# Patient Record
Sex: Female | Born: 1975
Health system: Southern US, Community
[De-identification: ages and names within clinical notes are randomized; demographics above are authoritative.]

## PROBLEM LIST (undated history)

## (undated) DIAGNOSIS — N289 Disorder of kidney and ureter, unspecified: Secondary | ICD-10-CM

## (undated) DIAGNOSIS — R7301 Impaired fasting glucose: Secondary | ICD-10-CM

## (undated) DIAGNOSIS — R002 Palpitations: Secondary | ICD-10-CM

## (undated) DIAGNOSIS — E785 Hyperlipidemia, unspecified: Secondary | ICD-10-CM

## (undated) DIAGNOSIS — E669 Obesity, unspecified: Secondary | ICD-10-CM

## (undated) DIAGNOSIS — F419 Anxiety disorder, unspecified: Secondary | ICD-10-CM

## (undated) DIAGNOSIS — K8020 Calculus of gallbladder without cholecystitis without obstruction: Secondary | ICD-10-CM

## (undated) DIAGNOSIS — K5900 Constipation, unspecified: Secondary | ICD-10-CM

## (undated) DIAGNOSIS — Z91018 Allergy to other foods: Secondary | ICD-10-CM

## (undated) DIAGNOSIS — R079 Chest pain, unspecified: Secondary | ICD-10-CM

## (undated) DIAGNOSIS — N200 Calculus of kidney: Secondary | ICD-10-CM

## (undated) HISTORY — DX: Allergy to other foods: Z91.018

## (undated) HISTORY — DX: Constipation, unspecified: K59.00

## (undated) HISTORY — DX: Calculus of kidney: N20.0

## (undated) HISTORY — DX: Calculus of gallbladder without cholecystitis without obstruction: K80.20

## (undated) HISTORY — PX: APPENDECTOMY: SHX54

## (undated) HISTORY — DX: Chest pain, unspecified: R07.9

## (undated) HISTORY — DX: Hyperlipidemia, unspecified: E78.5

## (undated) HISTORY — DX: Impaired fasting glucose: R73.01

## (undated) HISTORY — DX: Palpitations: R00.2

## (undated) HISTORY — DX: Disorder of kidney and ureter, unspecified: N28.9

## (undated) HISTORY — DX: Anxiety disorder, unspecified: F41.9

## (undated) HISTORY — DX: Obesity, unspecified: E66.9

## (undated) HISTORY — PX: CHOLECYSTECTOMY: SHX55

---

## 1999-08-27 ENCOUNTER — Other Ambulatory Visit: Admission: RE | Admit: 1999-08-27 | Discharge: 1999-08-27 | Payer: Self-pay | Admitting: Obstetrics and Gynecology

## 2000-12-27 ENCOUNTER — Other Ambulatory Visit: Admission: RE | Admit: 2000-12-27 | Discharge: 2000-12-27 | Payer: Self-pay | Admitting: Obstetrics and Gynecology

## 2002-11-03 ENCOUNTER — Other Ambulatory Visit: Admission: RE | Admit: 2002-11-03 | Discharge: 2002-11-03 | Payer: Self-pay | Admitting: *Deleted

## 2002-11-07 ENCOUNTER — Encounter: Payer: Self-pay | Admitting: Emergency Medicine

## 2002-11-07 ENCOUNTER — Observation Stay (HOSPITAL_COMMUNITY): Admission: EM | Admit: 2002-11-07 | Discharge: 2002-11-08 | Payer: Self-pay | Admitting: Emergency Medicine

## 2002-11-07 ENCOUNTER — Encounter (INDEPENDENT_AMBULATORY_CARE_PROVIDER_SITE_OTHER): Payer: Self-pay | Admitting: Specialist

## 2003-11-27 ENCOUNTER — Other Ambulatory Visit: Admission: RE | Admit: 2003-11-27 | Discharge: 2003-11-27 | Payer: Self-pay | Admitting: *Deleted

## 2005-03-03 ENCOUNTER — Encounter (INDEPENDENT_AMBULATORY_CARE_PROVIDER_SITE_OTHER): Payer: Self-pay | Admitting: Specialist

## 2005-03-03 ENCOUNTER — Ambulatory Visit (HOSPITAL_COMMUNITY): Admission: RE | Admit: 2005-03-03 | Discharge: 2005-03-03 | Payer: Self-pay | Admitting: *Deleted

## 2009-03-19 ENCOUNTER — Ambulatory Visit: Payer: Self-pay | Admitting: Diagnostic Radiology

## 2009-03-19 ENCOUNTER — Emergency Department (HOSPITAL_BASED_OUTPATIENT_CLINIC_OR_DEPARTMENT_OTHER): Admission: EM | Admit: 2009-03-19 | Discharge: 2009-03-19 | Payer: Self-pay | Admitting: Emergency Medicine

## 2011-02-27 NOTE — Op Note (Signed)
Denise Strong, Denise Strong                 ACCOUNT NO.:  [id number]   MEDICAL RECORD NO.:  [id number]          PATIENT TYPE:  AMB   LOCATION:  DAY                          FACILITY:  [facility]   PHYSICIAN:  Vikki Ports, MDDATE OF BIRTH:  1975-12-29   DATE OF PROCEDURE:  03/03/2005  DATE OF DISCHARGE:                                 OPERATIVE REPORT   PREOPERATIVE DIAGNOSIS:  Symptomatic cholelithiasis.   POSTOPERATIVE DIAGNOSIS:  Symptomatic cholelithiasis.   PROCEDURE:  Laparoscopic cholecystectomy.   SURGEON:  Vikki Ports, M.D.   ASSISTANT:  None.   ANESTHESIA:  General anesthesia.   DESCRIPTION OF PROCEDURE:  Patient was taken to the operating room and  placed in the supine position.  After adequate general anesthesia was  induced using endotracheal tube, the abdomen was prepped and draped in  normal sterile fashion.  Using a vertically oriented supraumbilical  incision, I dissected down to the fascia.  It was opened vertically.  An 0  Vicryl pursestring suture was placed around the fascial defect and Hasson  trocar was placed.  Pneumoperitoneum was obtained and under direct  visualization, an 11 mm trocar was placed in the subxiphoid region, two 5 mm  trocars placed in the right abdomen.  Gallbladder was identified and  retracted cephalad.  Dissection began at the neck which easily showed a long  thin cystic duct which was triply clipped and divided.  Cystic artery was  dissected in a similar fashion creating an excellent window posterior to it.  It was triply clipped and divided.  Gallbladder was on a very thin  mesentery.  It was taken off the gallbladder bed without difficulty and  removed through the umbilical port.  Fascial defect was closed with Vicryl  suture.  Skin incisions were closed with subcuticular 4-0 Monocryl.  Steri-  Strips and Dermabond was applied.  The patient tolerated the procedure well  and went to PACU in good condition.      KRH/MEDQ   D:  03/03/2005  T:  03/03/2005  Job:  [id number]

## 2011-02-27 NOTE — Op Note (Signed)
   Denise Strong, Denise Strong                             ACCOUNT NO.:  [id number]   MEDICAL RECORD NO.:  [id number]                   PATIENT TYPE:  OBV   LOCATION:  5737                                 FACILITY:  [facility]   PHYSICIAN:  Vikki Ports, M.D.         DATE OF BIRTH:  1975-12-12   DATE OF PROCEDURE:  11/07/2002  DATE OF DISCHARGE:  11/08/2002                                 OPERATIVE REPORT   PREOPERATIVE DIAGNOSIS:  Acute appendicitis.   POSTOPERATIVE DIAGNOSIS:  Acute appendicitis.   PROCEDURE:  Laparoscopic appendectomy   SURGEON:  Vikki Ports, M.D.   ANESTHESIA:  General   DESCRIPTION OF PROCEDURE:  The patient was taken to the operating room and  placed in the supine position.  After adequate anesthesia was induced with  endotracheal tube, the abdomen was prepped and draped in a normal sterile  fashion.  Using a transverse infraumbilical incision, I dissected down to  the fascia.  This was opened vertically.  An 0 Vicryl purse string suture  was placed on the fascial defect and the peritoneum was entered.  Hasson  trocar was placed in the abdomen, and pneumoperitoneum was obtained.  A 12  mm port was placed in the left suprapubic region and a 5 mm port was placed  in the right abdomen.  The appendix was identified.  It appeared acutely  inflamed with minimal free fluid but no evidence of perforation.  It was  retracted Anteriorly and using the Harmonic scalpel, the mesoappendix was  taken down.  This freed up the entire base of the appendix which was  transected using an endoscopic GIA stapling device.  Adequate hemostasis was  insured and the appendix was placed in an Endo-Catch bag and removed through  the umbilical port.  The right lower quadrant was then copiously irrigated.  Pneumoperitoneum was released.  The infraumbilical fascial defect was closed  with 0 Vicryl purse string suture.  The skin incisions were closed with  staples.  Incisions were  injected using 0.5 Marcaine.  Sterile dressings  were applied.  The patient tolerated the procedure well, and she went to  Post Anesthesia Care Unit in good condition                                               Vikki Ports, M.D.    KRH/MEDQ  D:  11/11/2002  T:  11/11/2002  Job:  [id number]

## 2013-01-02 ENCOUNTER — Other Ambulatory Visit: Payer: Self-pay | Admitting: Obstetrics and Gynecology

## 2013-01-02 DIAGNOSIS — N644 Mastodynia: Secondary | ICD-10-CM

## 2013-01-03 ENCOUNTER — Other Ambulatory Visit: Payer: Self-pay | Admitting: Obstetrics and Gynecology

## 2013-01-03 DIAGNOSIS — N644 Mastodynia: Secondary | ICD-10-CM

## 2013-01-13 ENCOUNTER — Ambulatory Visit
Admission: RE | Admit: 2013-01-13 | Discharge: 2013-01-13 | Disposition: A | Discharge status: Home / Self Care | Payer: 59 | Source: Ambulatory Visit | Attending: Obstetrics and Gynecology | Admitting: Obstetrics and Gynecology

## 2013-01-13 DIAGNOSIS — N644 Mastodynia: Secondary | ICD-10-CM

## 2013-03-11 ENCOUNTER — Emergency Department (HOSPITAL_BASED_OUTPATIENT_CLINIC_OR_DEPARTMENT_OTHER): Payer: 59

## 2013-03-11 ENCOUNTER — Emergency Department (HOSPITAL_BASED_OUTPATIENT_CLINIC_OR_DEPARTMENT_OTHER)
Admission: EM | Admit: 2013-03-11 | Discharge: 2013-03-11 | Disposition: A | Discharge status: Home / Self Care | Payer: 59 | Attending: Emergency Medicine | Admitting: Emergency Medicine

## 2013-03-11 ENCOUNTER — Encounter (HOSPITAL_BASED_OUTPATIENT_CLINIC_OR_DEPARTMENT_OTHER): Payer: Self-pay

## 2013-03-11 DIAGNOSIS — S40011A Contusion of right shoulder, initial encounter: Secondary | ICD-10-CM

## 2013-03-11 DIAGNOSIS — Z880 Allergy status to penicillin: Secondary | ICD-10-CM | POA: Insufficient documentation

## 2013-03-11 DIAGNOSIS — S40019A Contusion of unspecified shoulder, initial encounter: Secondary | ICD-10-CM | POA: Insufficient documentation

## 2013-03-11 DIAGNOSIS — Z87448 Personal history of other diseases of urinary system: Secondary | ICD-10-CM | POA: Insufficient documentation

## 2013-03-11 DIAGNOSIS — Y9389 Activity, other specified: Secondary | ICD-10-CM | POA: Insufficient documentation

## 2013-03-11 DIAGNOSIS — Z79899 Other long term (current) drug therapy: Secondary | ICD-10-CM | POA: Insufficient documentation

## 2013-03-11 DIAGNOSIS — Y92410 Unspecified street and highway as the place of occurrence of the external cause: Secondary | ICD-10-CM | POA: Insufficient documentation

## 2013-03-11 HISTORY — DX: Disorder of kidney and ureter, unspecified: N28.9

## 2013-03-11 MED ORDER — HYDROCODONE-ACETAMINOPHEN 5-325 MG PO TABS: 2.0000 | ORAL_TABLET | ORAL | Status: DC | PRN
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
## 2014-03-07 DIAGNOSIS — N390 Urinary tract infection, site not specified: Secondary | ICD-10-CM | POA: Insufficient documentation

## 2014-03-21 DIAGNOSIS — G43909 Migraine, unspecified, not intractable, without status migrainosus: Secondary | ICD-10-CM | POA: Insufficient documentation

## 2014-03-21 DIAGNOSIS — E7849 Other hyperlipidemia: Secondary | ICD-10-CM | POA: Insufficient documentation

## 2014-03-21 DIAGNOSIS — N200 Calculus of kidney: Secondary | ICD-10-CM | POA: Insufficient documentation

## 2014-03-21 DIAGNOSIS — R1084 Generalized abdominal pain: Secondary | ICD-10-CM | POA: Insufficient documentation

## 2014-03-21 DIAGNOSIS — J309 Allergic rhinitis, unspecified: Secondary | ICD-10-CM | POA: Insufficient documentation

## 2015-12-03 DIAGNOSIS — H52223 Regular astigmatism, bilateral: Secondary | ICD-10-CM | POA: Diagnosis not present

## 2015-12-03 DIAGNOSIS — H5203 Hypermetropia, bilateral: Secondary | ICD-10-CM | POA: Diagnosis not present

## 2016-01-02 DIAGNOSIS — Z0000 Encounter for general adult medical examination without abnormal findings: Secondary | ICD-10-CM | POA: Diagnosis not present

## 2016-01-02 LAB — BASIC METABOLIC PANEL
BUN: 14 (ref 4–21)
Creatinine: 0.6 (ref 0.5–1.1)
Glucose: 100
Potassium: 4.1 (ref 3.4–5.3)
Sodium: 142 (ref 137–147)

## 2016-01-02 LAB — CBC AND DIFFERENTIAL
HEMATOCRIT: 42 (ref 36–46)
HEMOGLOBIN: 14.3 (ref 12.0–16.0)
NEUTROS ABS: 4
PLATELETS: 264 (ref 150–399)
WBC: 7.2

## 2016-01-02 LAB — LIPID PANEL
CHOLESTEROL: 209 — AB (ref 0–200)
HDL: 48 (ref 35–70)
LDL CALC: 143
TRIGLYCERIDES: 229 — AB (ref 40–160)

## 2016-01-02 LAB — HEPATIC FUNCTION PANEL
ALK PHOS: 61 (ref 25–125)
ALT: 12 (ref 7–35)
AST: 11 — AB (ref 13–35)
Bilirubin, Total: 0.6

## 2016-01-16 DIAGNOSIS — G43009 Migraine without aura, not intractable, without status migrainosus: Secondary | ICD-10-CM | POA: Diagnosis not present

## 2016-01-16 DIAGNOSIS — E784 Other hyperlipidemia: Secondary | ICD-10-CM | POA: Diagnosis not present

## 2016-01-16 DIAGNOSIS — B373 Candidiasis of vulva and vagina: Secondary | ICD-10-CM | POA: Diagnosis not present

## 2016-01-16 DIAGNOSIS — Z0000 Encounter for general adult medical examination without abnormal findings: Secondary | ICD-10-CM | POA: Insufficient documentation

## 2016-02-13 DIAGNOSIS — I499 Cardiac arrhythmia, unspecified: Secondary | ICD-10-CM | POA: Insufficient documentation

## 2016-02-13 DIAGNOSIS — R002 Palpitations: Secondary | ICD-10-CM | POA: Insufficient documentation

## 2016-02-20 ENCOUNTER — Ambulatory Visit (INDEPENDENT_AMBULATORY_CARE_PROVIDER_SITE_OTHER): Payer: 59 | Admitting: Cardiovascular Disease

## 2016-02-20 ENCOUNTER — Encounter: Payer: Self-pay | Admitting: Cardiovascular Disease

## 2016-02-20 ENCOUNTER — Encounter (INDEPENDENT_AMBULATORY_CARE_PROVIDER_SITE_OTHER): Payer: 59

## 2016-02-20 VITALS — BP 118/72 | HR 64 | Ht 65.0 in | Wt 174.6 lb

## 2016-02-20 DIAGNOSIS — R072 Precordial pain: Secondary | ICD-10-CM | POA: Diagnosis not present

## 2016-02-20 DIAGNOSIS — R002 Palpitations: Secondary | ICD-10-CM

## 2016-02-20 DIAGNOSIS — R079 Chest pain, unspecified: Secondary | ICD-10-CM | POA: Diagnosis not present

## 2016-02-20 DIAGNOSIS — E785 Hyperlipidemia, unspecified: Secondary | ICD-10-CM | POA: Diagnosis not present

## 2016-02-20 HISTORY — DX: Palpitations: R00.2

## 2016-02-20 HISTORY — DX: Chest pain, unspecified: R07.9

## 2016-02-20 HISTORY — DX: Hyperlipidemia, unspecified: E78.5

## 2016-02-20 LAB — BASIC METABOLIC PANEL
BUN: 14 mg/dL (ref 7–25)
CALCIUM: 9.1 mg/dL (ref 8.6–10.2)
CO2: 29 mmol/L (ref 20–31)
CREATININE: 0.56 mg/dL (ref 0.50–1.10)
Chloride: 104 mmol/L (ref 98–110)
Glucose, Bld: 85 mg/dL (ref 65–99)
Potassium: 4.1 mmol/L (ref 3.5–5.3)
SODIUM: 140 mmol/L (ref 135–146)

## 2016-02-20 LAB — MAGNESIUM: MAGNESIUM: 2.1 mg/dL (ref 1.5–2.5)

## 2016-02-20 NOTE — Progress Notes (Signed)
Cardiology Office Note   Date:  02/20/2016   ID:  [name], DOB [birth year], MRN [id number]  PCP:  Garlan Fillers, MD Cardiologist:   Skeet Latch, MD   Chief Complaint  Patient presents with  . 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 Ms. Crivelli saw her PCP, Dr. Addison Naegeli, in April.   A week prior to that appointment she had an episode of palpitations that was severe and associated with lightheadedness.  The rhythm was irregular and lasted for 30-45 minutes.  It improved after coughing.  Since then she has noted palpitations that occur almost daily and last for less than a minute.  They are associated with shortness of breath and sometimes with chest pressure.  She denies syncope.  They are not associated with work, stress, or caffeine.  She also notes chest pressure that occurs randomly.  There is no associated shortness of breath, nausea or diaphoresis.  The episodes last for a few minutes and radiate across her chest.  They are 0.5-1/10 in severity.  She does not exercise regularly but when she has time she likes to run.  She runs for approximately 30 minutes and does not get chest pain or shortness of breath.  She has not noted any lower extremity edema, orthopnea or PND.    Ms. Hallisey notes that her cholesterol levels have always been mildly elevated.  She has not been interested in taking a statin.    Past Medical History  Diagnosis Date  . Renal disorder   . Palpitations 02/20/2016  . Chest pain 02/20/2016  . Hyperlipidemia 02/20/2016     Past Surgical History  Procedure Laterality Date  . Appendectomy    . Cholecystectomy       No current outpatient prescriptions on file.   No current facility-administered medications for this visit.    Allergies:   Ceclor; Erythromycin; Iodine; and Penicillins    Social History:  The patient  reports that she quit smoking about 21 years ago. She has never used smokeless tobacco. She reports that she does not drink alcohol or use illicit drugs.   Family History:  The patient's family history includes AAA (abdominal aortic aneurysm) in her maternal grandfather; Dementia in her paternal grandfather; Diabetes in her brother, maternal grandmother, and mother; Hypertension in her father; Stroke in her father.    ROS:  Please see the history of present illness.   Otherwise, review of systems are positive for none.   All other systems are reviewed and negative.    PHYSICAL EXAM: VS:  BP 118/72 mmHg  Pulse 64  Ht 5\' 5"  (1.651 m)  Wt 79.198 kg (174 lb 9.6 oz)  BMI 29.05 kg/m2 , BMI Body mass index is 29.05 kg/(m^2). GENERAL:  Well appearing HEENT:  Pupils equal round and reactive, fundi not visualized, oral mucosa unremarkable NECK:  No jugular venous distention, waveform within normal limits, carotid upstroke brisk and symmetric, no bruits, no thyromegaly LYMPHATICS:  No cervical adenopathy LUNGS:  Clear to auscultation bilaterally HEART:  RRR.  PMI not displaced or sustained,S1 and S2 within normal limits, no  S3, no S4, no clicks, no rubs, no murmurs ABD:  Flat, positive bowel sounds normal in frequency in pitch, no bruits, no rebound, no guarding, no midline pulsatile mass, no hepatomegaly, no splenomegaly EXT:  2 plus pulses throughout, no edema, no cyanosis no clubbing SKIN:  No rashes no nodules NEURO:  Cranial nerves II through XII grossly intact, motor grossly intact throughout PSYCH:  Cognitively intact, oriented to person place and time   EKG:  EKG is ordered  today. The ekg ordered today demonstrates sinus rhythm. Rate 64 bpm.   Recent Labs: No results found for requested labs within last 365 days.    Lipid Panel No results found for: CHOL, TRIG, HDL, CHOLHDL, VLDL, LDLCALC, LDLDIRECT    Wt Readings from Last 3 Encounters:  02/20/16 79.198 kg (174 lb 9.6 oz)  03/11/13 74.844 kg (165 lb)      ASSESSMENT AND PLAN:  # Palpitations: Ms. Wainright's palpitations are likely PACs and PVCs.  We will obtain a 7 day event monitor to better assess.  We will also check a basic metabolic panel, magnesium, TSH and free T4.  She had a CBC with her PCP recently.  We will obtain these records.    # Chest pain: Ms. Mckibben's symptoms are likely not due to ischemia given that she does not have exertional chest discomfort. However, we will obtain an ETT to ensure that this is not an atypical presentation of angina.   # Hyperlipidemia: Ms. Ahlstrom reports a history of elevated lipid.  We will obtain these records to calculate her ASCVD 10 year risk.  We discussed the importance of increasing her exercise to at least 30-40 minutes most days of the week and improving her diet.   Current medicines are reviewed at length with the patient today.  The patient does not have concerns regarding medicines.  The following changes have been made:  no change  Labs/ tests ordered today include:   Orders Placed This Encounter  Procedures  . T4, free  . TSH  . Basic metabolic panel  . Magnesium  . Cardiac event monitor  . Exercise Tolerance Test  . EKG 12-Lead     Disposition:   FU with Reza Crymes C. Oval Linsey, MD, [facility] in     This note was written with the assistance of speech recognition software.  Please excuse any transcriptional errors.  Signed, Anira Senegal C. Oval Linsey, MD, [facility]  02/20/2016 10:50 PM    [facility]

## 2016-02-20 NOTE — Patient Instructions (Addendum)
Medication Instructions:  NO ADDED MEDICATIONS TODAY   Labwork: BMET/MAGNESIUM/TSH/FT4 AT [facility] ON FIRST FLOOR  Testing/Procedures: Your physician has recommended that you wear an event monitor. Event monitors are medical devices that record the heart's electrical activity. Doctors most often [place] these monitors to diagnose arrhythmias. Arrhythmias are problems with the speed or rhythm of the heartbeat. The monitor is a small, portable device. You can wear one while you do your normal daily activities. This is usually used to diagnose what is causing palpitations/syncope (passing out). [facility] has requested that you have an exercise tolerance test. For further information please visit [email or web address]. Please also follow instruction sheet, as given.  Follow-Up: Your physician recommends that you schedule a follow-up appointment in: [place]  If you need a refill on your cardiac medications before your next appointment, please call your pharmacy.

## 2016-02-21 DIAGNOSIS — R002 Palpitations: Secondary | ICD-10-CM

## 2016-02-21 LAB — T4, FREE: FREE T4: 1.1 ng/dL (ref 0.8–1.8)

## 2016-02-21 LAB — TSH: TSH: 1.31 m[IU]/L

## 2016-03-06 ENCOUNTER — Telehealth (HOSPITAL_COMMUNITY): Payer: Self-pay

## 2016-03-06 NOTE — Telephone Encounter (Signed)
UTR Encounter complete. 

## 2016-03-10 ENCOUNTER — Telehealth (HOSPITAL_COMMUNITY): Payer: Self-pay

## 2016-03-10 NOTE — Telephone Encounter (Signed)
Encounter complete. 

## 2016-03-11 ENCOUNTER — Ambulatory Visit (HOSPITAL_COMMUNITY)
Admission: RE | Admit: 2016-03-11 | Discharge: 2016-03-11 | Disposition: A | Discharge status: Home / Self Care | Payer: 59 | Source: Ambulatory Visit | Attending: Internal Medicine | Admitting: Internal Medicine

## 2016-03-11 DIAGNOSIS — R079 Chest pain, unspecified: Secondary | ICD-10-CM | POA: Diagnosis not present

## 2016-03-11 DIAGNOSIS — R002 Palpitations: Secondary | ICD-10-CM | POA: Insufficient documentation

## 2016-03-11 DIAGNOSIS — R5383 Other fatigue: Secondary | ICD-10-CM | POA: Insufficient documentation

## 2016-03-11 DIAGNOSIS — R0602 Shortness of breath: Secondary | ICD-10-CM | POA: Insufficient documentation

## 2016-03-11 LAB — EXERCISE TOLERANCE TEST
CHL CUP MPHR: 181 {beats}/min
CSEPEW: 11.8 METS
CSEPHR: 90 %
CSEPPHR: 164 {beats}/min
Exercise duration (min): 10 min
Exercise duration (sec): 2 s
RPE: 17
Rest HR: 67 {beats}/min

## 2016-03-13 ENCOUNTER — Telehealth: Payer: Self-pay | Admitting: *Deleted

## 2016-03-13 DIAGNOSIS — R0789 Other chest pain: Secondary | ICD-10-CM

## 2016-03-13 NOTE — Telephone Encounter (Signed)
-----   Message from Skeet Latch, MD sent at 03/11/2016  5:26 PM EDT ----- Stress test was equivocal.  Please order cardiac CT-A to evaluate for ischemia.

## 2016-03-13 NOTE — Telephone Encounter (Signed)
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

## 2016-03-18 ENCOUNTER — Telehealth (HOSPITAL_COMMUNITY): Payer: Self-pay

## 2016-03-18 NOTE — Telephone Encounter (Signed)
Encounter complete. 

## 2016-03-20 ENCOUNTER — Ambulatory Visit (HOSPITAL_COMMUNITY)
Admission: RE | Admit: 2016-03-20 | Discharge: 2016-03-20 | Disposition: A | Discharge status: Home / Self Care | Payer: 59 | Source: Ambulatory Visit | Attending: Cardiology | Admitting: Cardiology

## 2016-03-20 DIAGNOSIS — R42 Dizziness and giddiness: Secondary | ICD-10-CM | POA: Insufficient documentation

## 2016-03-20 DIAGNOSIS — R0609 Other forms of dyspnea: Secondary | ICD-10-CM | POA: Insufficient documentation

## 2016-03-20 DIAGNOSIS — R002 Palpitations: Secondary | ICD-10-CM | POA: Insufficient documentation

## 2016-03-20 DIAGNOSIS — R0789 Other chest pain: Secondary | ICD-10-CM | POA: Diagnosis not present

## 2016-03-20 LAB — MYOCARDIAL PERFUSION IMAGING
CHL CUP NUCLEAR SDS: 5
CSEPPHR: 100 {beats}/min
LV sys vol: 39 mL
LVDIAVOL: 95 mL (ref 46–106)
NUC STRESS TID: 1.13
Rest HR: 56 {beats}/min
SRS: 0
SSS: 5

## 2016-03-20 MED ORDER — TECHNETIUM TC 99M TETROFOSMIN IV KIT
10.4000 | PACK | Freq: Once | INTRAVENOUS | Status: AC | PRN
  Administered 2016-03-20: 10 via INTRAVENOUS
  Filled 2016-03-20: qty 10

## 2016-03-20 MED ORDER — REGADENOSON 0.4 MG/5ML IV SOLN
0.4000 mg | Freq: Once | INTRAVENOUS | Status: AC
  Administered 2016-03-20: 0.4 mg via INTRAVENOUS

## 2016-03-20 MED ORDER — TECHNETIUM TC 99M TETROFOSMIN IV KIT
31.1000 | PACK | Freq: Once | INTRAVENOUS | Status: AC | PRN
  Administered 2016-03-20: 31.1 via INTRAVENOUS
  Filled 2016-03-20: qty 31

## 2016-03-24 ENCOUNTER — Ambulatory Visit (INDEPENDENT_AMBULATORY_CARE_PROVIDER_SITE_OTHER): Payer: 59 | Admitting: Cardiovascular Disease

## 2016-03-24 VITALS — BP 113/64 | HR 78 | Ht 65.0 in | Wt 177.0 lb

## 2016-03-24 DIAGNOSIS — R079 Chest pain, unspecified: Secondary | ICD-10-CM

## 2016-03-24 DIAGNOSIS — R002 Palpitations: Secondary | ICD-10-CM

## 2016-03-24 DIAGNOSIS — E785 Hyperlipidemia, unspecified: Secondary | ICD-10-CM

## 2016-03-24 NOTE — Progress Notes (Signed)
Cardiology Office Note   Date:  03/24/2016   ID:  Denise Strong, DOB 11-06-1975, MRN [id number]  PCP:  Garlan Fillers, MD Cardiologist:   Skeet Latch, MD   Chief Complaint  Patient presents with  . 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 Denise Strong saw her PCP, Dr. Addison Naegeli, in April.   She was referred to cardiology for evaluation of palpitations and chest pain.  She wore a  7 day event monitor that showed sinus rhythm and sinus arrhythmia.  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  She notes that the palpitations sometimes occur in stressful situations.  She has been otherwise well and denies chest pain.  She did have an exercise stress test that revealed 1-1.43mm ST depressions.  She exercised for 10 minutes on a Bruce protocol (11.8 METS).  She was referred for nuclear stress testing that was negative for ischemia.  She denies lower extremity edema, orthopnea or PND.    Past Medical History  Diagnosis Date  . Renal disorder   . Palpitations 02/20/2016  . Chest pain 02/20/2016  . Hyperlipidemia 02/20/2016    Past Surgical History  Procedure Laterality Date  . Appendectomy    . Cholecystectomy       No current outpatient prescriptions on file.   No current facility-administered medications for this visit.    Allergies:   Ceclor; Erythromycin; Iodine; and Penicillins    Social History:  The patient  reports that she quit smoking about 21 years ago. She has never used smokeless tobacco. She reports that she does not drink alcohol or use illicit drugs.   Family History:  The patient's family history includes AAA (abdominal aortic aneurysm) in her maternal grandfather; Dementia in her paternal grandfather; Diabetes in her brother, maternal grandmother,  and mother; Hypertension in her father; Stroke in her father.    ROS:  Please see the history of present illness.   Otherwise, review of systems are positive for none.   All other systems are reviewed and negative.    PHYSICAL EXAM: VS:  BP 113/64 mmHg  Pulse 78  Ht 5\' 5"  (1.651 m)  Wt 177 lb (80.287 kg)  BMI 29.45 kg/m2 , BMI Body mass index is 29.45 kg/(m^2). GENERAL:  Well appearing HEENT:  Pupils equal round and reactive, fundi not visualized, oral mucosa unremarkable NECK:  No jugular venous distention, waveform within normal limits, carotid upstroke brisk and symmetric, no bruits LYMPHATICS:  No cervical adenopathy LUNGS:  Clear to auscultation bilaterally HEART:  RRR.  PMI not displaced or sustained,S1 and S2 within normal limits, no S3, no S4, no clicks, no rubs, no murmurs ABD:  Flat, positive bowel sounds normal in frequency in pitch, no bruits, no rebound, no guarding, no midline pulsatile mass, no hepatomegaly, no splenomegaly EXT:  2 plus pulses throughout, no edema, no cyanosis no clubbing SKIN:  No rashes no nodules NEURO:  Cranial nerves II through XII grossly intact, motor grossly intact throughout PSYCH:  Cognitively intact, oriented to person place and time  EKG:  EKG is not ordered today. The ekg ordered today demonstrates sinus rhythm. Rate 64 bpm.  7 Day Event Monitor 03/2016  Quality: Fair.  Baseline artifact.  Sinus rhythm and sinus arrhythmia noted. Patient  reported chest pain and heart fluttering.    Lexiscan Myoview 03/20/16:  The left ventricular ejection fraction is normal (55-65%).  Nuclear stress EF: 58%.  There was no ST segment deviation noted during stress.  The study is normal. no ischemia. no previous MI  This is a low risk study.  ETT 03/11/16:  Normal BP response to exercise.  There was 1 to 1.70mm of J point depression with horizontal to upsloping ST segment depression and peaking of the T waves in the inferolateral leads at peak  exercise.  Good exercise tolerance. The patient went 11.8 mets.  The patient had fatigue and SOB.  Equivocal EKG for ischemia. Recommend stress echo for further evaluation.   Recent Labs: 02/20/2016: BUN 14; Creat 0.56; Magnesium 2.1; Potassium 4.1; Sodium 140; TSH 1.31    Lipid Panel No results found for: CHOL, TRIG, HDL, CHOLHDL, VLDL, LDLCALC, LDLDIRECT    Wt Readings from Last 3 Encounters:  03/24/16 177 lb (80.287 kg)  03/20/16 175 lb (79.379 kg)  02/20/16 174 lb 9.6 oz (79.198 kg)      ASSESSMENT AND PLAN:  # Palpitations: [name]'s monitor was unremarkable.  She did have symptoms while wearing it.  I suspect that her symptoms may be stress related.  Her laboratory testing has been unremarkable.   # Chest pain: ETT was concerning for ischemia but was a false positive, as the nuclear stress was negative.   Current medicines are reviewed at length with the patient today.  The patient does not have concerns regarding medicines.  The following changes have been made:  no change  Labs/ tests ordered today include:   No orders of the defined types were placed in this encounter.     Disposition:   FU with Maigan Bittinger C. Oval Linsey, MD, [facility] as needed.  Time spent: 15 minutes-Greater than 50% of this time was spent in counseling, explanation of diagnosis, planning of further management, and coordination of care.   This note was written with the assistance of speech recognition software.  Please excuse any transcriptional errors.  Signed, Rhetta Cleek C. Oval Linsey, MD, [facility]  03/24/2016 4:10 PM    [facility]

## 2016-03-24 NOTE — Patient Instructions (Signed)
Your physician recommends that you schedule a follow-up appointment in: as needed  

## 2016-03-25 ENCOUNTER — Encounter: Payer: Self-pay | Admitting: Cardiovascular Disease

## 2016-05-05 DIAGNOSIS — Z1389 Encounter for screening for other disorder: Secondary | ICD-10-CM | POA: Diagnosis not present

## 2016-05-05 DIAGNOSIS — R319 Hematuria, unspecified: Secondary | ICD-10-CM | POA: Diagnosis not present

## 2016-05-05 DIAGNOSIS — Z130 Encounter for screening for diseases of the blood and blood-forming organs and certain disorders involving the immune mechanism: Secondary | ICD-10-CM | POA: Diagnosis not present

## 2016-05-05 DIAGNOSIS — Z6829 Body mass index (BMI) 29.0-29.9, adult: Secondary | ICD-10-CM | POA: Diagnosis not present

## 2016-05-05 DIAGNOSIS — N898 Other specified noninflammatory disorders of vagina: Secondary | ICD-10-CM | POA: Diagnosis not present

## 2016-05-05 DIAGNOSIS — Z01419 Encounter for gynecological examination (general) (routine) without abnormal findings: Secondary | ICD-10-CM | POA: Diagnosis not present

## 2016-05-05 MED FILL — metroNIDAZOLE 500 MG TABS: 500 | 7 days supply | Qty: 14 | Fill #0

## 2016-09-23 IMAGING — NM NM MISC PROCEDURE
6 series · 36 of 36 positions shown · non-contrast
Comparison: none

[Series 1: wbr rest · 6.40mm/px · 6 of 64 frames shown]
[frame 6/64]
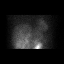
[frame 16/64]
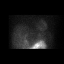
[frame 27/64]
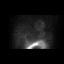
[frame 38/64]
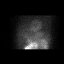
[frame 48/64]
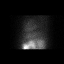
[frame 59/64]
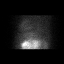

[Series 1: wbr_r-proj_st wbr rest · 6.40mm/px · 6 of 64 frames shown]
[frame 6/64]
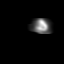
[frame 16/64]
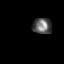
[frame 27/64]
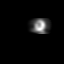
[frame 38/64]
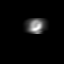
[frame 48/64]
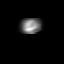
[frame 59/64]
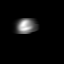

[Series 2: wbr stress-gsp · 6.40mm/px · 6 of 512 frames shown]
[frame 43/512]
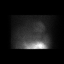
[frame 128/512]
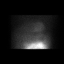
[frame 214/512]
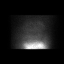
[frame 299/512]
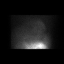
[frame 384/512]
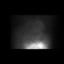
[frame 470/512]
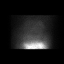

[Series 2: wbr_s-proj_st wbr stress-gsp · 6.40mm/px · 6 of 512 frames shown]
[frame 43/512]
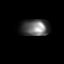
[frame 128/512]
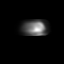
[frame 214/512]
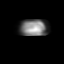
[frame 299/512]
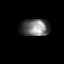
[frame 384/512]
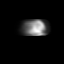
[frame 470/512]
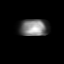

[Series 3: wbr stress-sum-em · 6.40mm/px · 6 of 64 frames shown]
[frame 6/64]
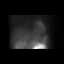
[frame 16/64]
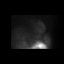
[frame 27/64]
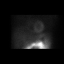
[frame 38/64]
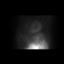
[frame 48/64]
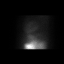
[frame 59/64]
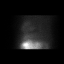

[Series 3: wbr_s-proj_st wbr stress-sum-em · 6.40mm/px · 6 of 64 frames shown]
[frame 6/64]
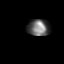
[frame 16/64]
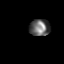
[frame 27/64]
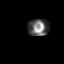
[frame 38/64]
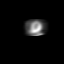
[frame 48/64]
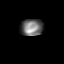
[frame 59/64]
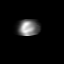

[36 of 36 positions shown; findings below may reference images not displayed]

Canned report from images found in remote index.

Refer to host system for actual result text.

## 2016-12-02 DIAGNOSIS — H52223 Regular astigmatism, bilateral: Secondary | ICD-10-CM | POA: Diagnosis not present

## 2016-12-02 DIAGNOSIS — H5203 Hypermetropia, bilateral: Secondary | ICD-10-CM | POA: Diagnosis not present

## 2017-05-27 MED FILL — FLUCONAZOLE 150 MG TABLET: 150 | 2 days supply | Qty: 2 | Fill #0

## 2017-06-17 DIAGNOSIS — Z0000 Encounter for general adult medical examination without abnormal findings: Secondary | ICD-10-CM | POA: Diagnosis not present

## 2017-06-17 DIAGNOSIS — Z124 Encounter for screening for malignant neoplasm of cervix: Secondary | ICD-10-CM | POA: Diagnosis not present

## 2017-06-17 DIAGNOSIS — Z1231 Encounter for screening mammogram for malignant neoplasm of breast: Secondary | ICD-10-CM | POA: Diagnosis not present

## 2017-06-17 DIAGNOSIS — Z130 Encounter for screening for diseases of the blood and blood-forming organs and certain disorders involving the immune mechanism: Secondary | ICD-10-CM | POA: Diagnosis not present

## 2017-06-17 DIAGNOSIS — Z1151 Encounter for screening for human papillomavirus (HPV): Secondary | ICD-10-CM | POA: Diagnosis not present

## 2017-06-17 DIAGNOSIS — Z01419 Encounter for gynecological examination (general) (routine) without abnormal findings: Secondary | ICD-10-CM | POA: Diagnosis not present

## 2017-06-17 DIAGNOSIS — Z1389 Encounter for screening for other disorder: Secondary | ICD-10-CM | POA: Diagnosis not present

## 2017-06-17 LAB — HM PAP SMEAR: HM Pap smear: NORMAL

## 2017-06-23 MED FILL — ATORVASTATIN 10 MG TABLET: 10 | 30 days supply | Qty: 30 | Fill #0

## 2017-08-09 MED FILL — ATORVASTATIN 10 MG TABLET: 10 | 30 days supply | Qty: 30 | Fill #1

## 2017-12-02 MED FILL — OSELTAMIVIR PHOSPHATE 75 MG: 75 | 10 days supply | Qty: 10 | Fill #0

## 2017-12-09 MED FILL — ATORVASTATIN 10 MG TABLET: 10 | 30 days supply | Qty: 30 | Fill #2

## 2017-12-20 MED FILL — ESCITALOPRAM 10 MG TABLET: 10 | 30 days supply | Qty: 30 | Fill #0

## 2018-01-17 MED FILL — ESCITALOPRAM 10 MG TABLET: 10 | 30 days supply | Qty: 30 | Fill #1

## 2018-01-17 MED FILL — ATORVASTATIN 10 MG TABLET: 10 | 30 days supply | Qty: 30 | Fill #3

## 2018-02-15 MED FILL — ATORVASTATIN 10 MG TABLET: 10 | 30 days supply | Qty: 30 | Fill #0

## 2018-02-15 MED FILL — ESCITALOPRAM 10 MG TABLET: 10 | 30 days supply | Qty: 30 | Fill #2

## 2018-03-15 MED FILL — ATORVASTATIN 10 MG TABLET: 10 | 30 days supply | Qty: 30 | Fill #1

## 2018-03-15 MED FILL — ESCITALOPRAM 10 MG TABLET: 10 | 30 days supply | Qty: 30 | Fill #3

## 2018-04-18 MED FILL — ESCITALOPRAM 10 MG TABLET: 10 | 30 days supply | Qty: 30 | Fill #0

## 2018-04-18 MED FILL — ATORVASTATIN 10 MG TABLET: 10 | 30 days supply | Qty: 30 | Fill #2

## 2018-05-06 ENCOUNTER — Ambulatory Visit: Payer: 59 | Admitting: Family Medicine

## 2018-05-13 ENCOUNTER — Encounter: Payer: Self-pay | Admitting: Family Medicine

## 2018-05-13 ENCOUNTER — Ambulatory Visit (INDEPENDENT_AMBULATORY_CARE_PROVIDER_SITE_OTHER): Payer: 59 | Admitting: Family Medicine

## 2018-05-13 VITALS — BP 116/76 | HR 74 | Ht 65.0 in | Wt 196.0 lb

## 2018-05-13 DIAGNOSIS — R002 Palpitations: Secondary | ICD-10-CM

## 2018-05-13 DIAGNOSIS — F39 Unspecified mood [affective] disorder: Secondary | ICD-10-CM | POA: Insufficient documentation

## 2018-05-13 DIAGNOSIS — E785 Hyperlipidemia, unspecified: Secondary | ICD-10-CM | POA: Diagnosis not present

## 2018-05-13 DIAGNOSIS — F4323 Adjustment disorder with mixed anxiety and depressed mood: Secondary | ICD-10-CM | POA: Diagnosis not present

## 2018-05-13 DIAGNOSIS — F411 Generalized anxiety disorder: Secondary | ICD-10-CM | POA: Insufficient documentation

## 2018-05-13 DIAGNOSIS — J3089 Other allergic rhinitis: Secondary | ICD-10-CM | POA: Diagnosis not present

## 2018-05-13 NOTE — Progress Notes (Signed)
New patient office visit note:  Impression and Recommendations:    1. Hyperlipidemia, unspecified hyperlipidemia type   2. Environmental and seasonal allergies   3. Palpitations   4. Adjustment disorder with mixed anxiety and depressed mood     - Patient had exercise stress test and telemetry through Cardiology in May/June 2017; all completely normal at that time.  1. Specialty Care - Encouraged patient to continue following up with OBGYN and other specialists as recommended, especially for yearly screenings.  - Emphasized the need for prudent self-care, including following up with specialty care.  2. Mood & Anxiety - Continue Lexapro as prescribed.  No changes made today.  - Encouraged patient to consider seeking the assistance of counselor/life coach.  - Emphasized the need for a multifaceted approach to addressing mood management, including prudent lifestyle habits, healthy diet, good sleep hygiene, adequate physical activity, meditation/prayer/relaxation, along with medication and life coaching/counseling.  3. Hyperlipidemia - Continue Lipitor as prescribed.  No changes made today.  - Encouraged adequate hydration to reduce potential S-E of statins, such as muscle aching and cramping.  - Advised patient about need for blood work to evaluate liver enzymes, as well as lipid pane changes since starting Lipitor approximately 6 months ago.  4. BMI Counseling Explained to patient what BMI refers to, and what it means medically.    Told patient to think about it as a "medical risk stratification measurement" and how increasing BMI is associated with increasing risk/ or worsening state of various diseases such as hypertension, hyperlipidemia, diabetes, premature OA, depression etc.  American Heart Association guidelines for healthy diet, basically Mediterranean diet, and exercise guidelines of 30 minutes 5 days per week or more discussed in detail.  - Encouraged patient to  continue using programs like Weight Watchers to manage her weight.  Patient knows that she may return to the clinic as desired for assistance with weight loss.  Health counseling performed.  All questions answered.  5. Lifestyle & Preventative Health Maintenance - Advised patient to continue working toward exercising to improve overall mental, physical, and emotional health.    - Encouraged patient to engage in daily physical activity, especially a formal exercise routine.  Recommended that the patient eventually strive for at least 150 minutes of moderate cardiovascular activity per week according to guidelines established by the [facility].   - Healthy dietary habits encouraged, including low-carb, and high amounts of lean protein in diet.   - Patient should also consume adequate amounts of water - half of body weight in oz of water per day.   Education and routine counseling performed. Handouts provided.  6. Follow-Up - Need for lab work and review OV in near future.  - Encouraged patient to return for yearly physical & screening blood work.  - Otherwise, return to clinic for regularly scheduled chronic follow-up, and acute concerns PRN.   Orders Placed This Encounter  Procedures  . CBC with Differential/Platelet  . Hemoglobin A1c  . TSH  . VITAMIN D 25 Hydroxy (Vit-D Deficiency, Fractures)  . Lipid panel  . Comprehensive metabolic panel  . T4, free    No orders of the defined types were placed in this encounter.   Gross side effects, risk and benefits, and alternatives of medications discussed with patient.  Patient is aware that all medications have potential side effects and we are unable to predict every side effect or drug-drug interaction that may occur.  Expresses verbal understanding and consents to current therapy plan  and treatment regimen.  Return for CPE/ yrly physical, come fasting.  Please see AVS handed out to patient at the end of our visit for further patient  instructions/ counseling done pertaining to today's office visit.    Note: This document was prepared using Dragon voice recognition software and may include unintentional dictation errors.   This document serves as a record of services personally performed by Mellody Dance, DO. It was created on her behalf by Toni Amend, a trained medical scribe. The creation of this record is based on the scribe's personal observations and the provider's statements to them.   I have reviewed the above medical documentation for accuracy and completeness and I concur.  Mellody Dance 07/03/18 4:45 PM    -------------------------------------------------------------------------------------------------------------    Subjective:    Chief complaint:   Chief Complaint  Patient presents with  . Establish Care    HPI: Denise Strong is a pleasant 42 y.o. female who presents to [place] [facility] today to review their medical history with me and establish care.   I asked the patient to review their chronic problem list with me to ensure everything was updated and accurate.    All recent office visits with other providers, any medical records that patient brought in etc  - I reviewed today.     We asked pt to get [place] their medical records from [facility] providers/ specialists that they had seen within the past 3-5 years- if they are in private practice and/or do not work for [employer], [facility], [place], [place] or [employer] owned practice.  Told them to call their specialists to clarify this if they are not sure.   Patient lives in the community and wanted to establish care locally.  Social History Lives with wife Denise Strong and two kids; twins are starting school this fall. Kids, boy and girl, are 19 years old; starting kindergarten soon.  Works at [employer], through [employer]. Has been an Therapist, sports for 19 years; works in [place]. Met wife in nursing school at [employer] [facility] [employer].  Ex  athlete - 5 years ago, she was a Academic librarian, runner, cyclist.  Kids came along, and her "world stopped."  She largely stopped exercising.  However, she and her wife are doing the [place] in April in [place].  Tobacco Use Former smoker, quit around 1996. Smoked in college; has not smoked for 20 years. "College weekend beer drinking" socially.  EtOH Use Drinks socially, no excessive concerns.  Family History Mom, maternal side with diabetes. Mom developed diabetes in early 29's. Brother was 21 when he developed diabetes. Grandparents in their 68's.  Dad with HTN; had a mini stroke a couple years ago, age 69. Stroke at age 65.  He also has terrible arthritis.  No FMHx of colon, breast, lung cancer. No heart disease in family.  No depression or suicide. Maternal aunt with anxiety; her son died of a heart attack a couple years ago.  Surgical History Past Surgical History:  Procedure Laterality Date  . APPENDECTOMY    . CHOLECYSTECTOMY     Past Medical History  - Mood & Anxiety Started Lexapro in March; notes "it's a lifesaver and should have been on it earlier."  States she finally made this decision to be a better wife and parent, and it's changed her life.  Patient does not have a counselor she sees regularly.  - [place] up with OBGYN Paula Compton.  Has had  a mammogram; went to [facility] and got a mammogram a couple of years ago due to a concern she was having; nothing was found.  Had her mammogram at age 73 as recommended.  - Cholesterol - Hyperlipidemia Managed on Lipitor; prescribed by OBGYN a year and a half ago.  She's taken it religiously for the last six months.  She has not had repeat liver enzymes evaluated.  Denies aching or other symptoms.  Notes she doesn't drink enough water.  - Renal Tubular Acidosis Had 13 kidney stones. Was seeing a specialist in [facility], 2-3 years ago; was told she had renal tubular acidosis.  No other  kidney stones or issues since; kidney functions have been normal since then.  Denies swelling in her legs.   Wt Readings from Last 3 Encounters:  05/13/18 196 lb (88.9 kg)  03/24/16 177 lb (80.3 kg)  03/20/16 175 lb (79.4 kg)   BP Readings from Last 3 Encounters:  05/13/18 116/76  03/24/16 113/64  02/20/16 118/72   Pulse Readings from Last 3 Encounters:  05/13/18 74  03/24/16 78  02/20/16 64   BMI Readings from Last 3 Encounters:  05/13/18 32.62 kg/m  03/24/16 29.45 kg/m  03/20/16 29.12 kg/m    Patient Care Team    Relationship Specialty Notifications Start End  Mellody Dance, DO PCP - General Family Medicine  05/13/18   Skeet Latch, MD Attending Physician Cardiology  05/13/18   Garlan Fillers, MD Consulting Physician Family Medicine  05/16/18     Patient Active Problem List   Diagnosis Date Noted  . Mood disorder ([place]) 05/13/2018  . Environmental and seasonal allergies 05/13/2018  . Adjustment disorder with mixed anxiety and depressed mood 05/13/2018  . Chest pain 02/20/2016  . Hyperlipidemia 02/20/2016  . Palpitations 02/13/2016  . Irregular heart beat 02/13/2016  . Encounter for health maintenance examination 01/16/2016  . Allergic rhinitis 03/21/2014  . 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 Generalized abdominal pain 03/21/2014  . Migraine headache 03/21/2014  . 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 Lower urinary tract infectious disease 03/07/2014     Past Medical History:  Diagnosis Date  . Chest pain 02/20/2016  . Hyperlipidemia 02/20/2016  . Palpitations 02/20/2016  . Renal disorder      Past Medical History:  Diagnosis Date  . Chest pain 02/20/2016  . Hyperlipidemia 02/20/2016  . Palpitations 02/20/2016  . Renal disorder      Past Surgical History:  Procedure Laterality Date  . APPENDECTOMY    . CHOLECYSTECTOMY       Family History  Problem Relation Age of Onset  . Diabetes Mother   . Hypertension Father   . Stroke  Father   . Diabetes Brother   . Diabetes Maternal Grandmother   . AAA (abdominal aortic aneurysm) Maternal Grandfather   . Dementia Paternal Grandfather      Social History   Substance and Sexual Activity  Drug Use No     Social History   Substance and Sexual Activity  Alcohol Use No  . Alcohol/week: 0.0 standard drinks     Social History   Tobacco Use  Smoking Status Former Smoker  . Last attempt to quit: 02/20/1995  . Years since quitting: 23.3  Smokeless Tobacco Never Used  Tobacco Comment   social; in college     Current Meds  Medication Sig  . atorvastatin (LIPITOR) 10 MG tablet Take 10 mg by mouth daily.  Marland Kitchen escitalopram (LEXAPRO) 10 MG tablet Take 10 mg  by mouth daily.  Marland Kitchen loratadine (CLARITIN) 10 MG tablet Take 10 mg by mouth daily.    Allergies: Ceclor [cefaclor]; Erythromycin; Iodine; and Penicillins   Review of Systems  Constitutional: Negative for chills, diaphoresis, fever, malaise/fatigue and weight loss.  HENT: Negative for congestion, sore throat and tinnitus.   Eyes: Negative for blurred vision, double vision and photophobia.  Respiratory: Negative for cough and wheezing.   Cardiovascular: Negative for chest pain and palpitations.  Gastrointestinal: Negative for blood in stool, diarrhea, nausea and vomiting.  Genitourinary: Negative for dysuria, frequency and urgency.  Musculoskeletal: Negative for joint pain and myalgias.  Skin: Negative for itching and rash.  Neurological: Negative for dizziness, focal weakness, weakness and headaches.  Endo/Heme/Allergies: Negative for environmental allergies and polydipsia. Does not bruise/bleed easily.  Psychiatric/Behavioral: Negative for depression and memory loss. The patient is not nervous/anxious and does not have insomnia.      Objective:   Blood pressure 116/76, pulse 74, height _0  (1.651 m), weight 196 lb (88.9 kg), last menstrual period 05/08/2018. Body mass index is 32.62 kg/m. General:  Well Developed, well nourished, and in no acute distress.  Neuro: Alert and oriented x3, extra-ocular muscles intact, sensation grossly intact.  HEENT:[place]/AT, PERRLA, neck supple, No carotid bruits Skin: no gross rashes  Cardiac: Regular rate and rhythm Respiratory: Essentially clear to auscultation bilaterally. Not using accessory muscles, speaking in full sentences.  Abdominal: not grossly distended Musculoskeletal: Ambulates w/o diff, FROM * 4 ext.  Vasc: less 2 sec cap RF, warm and pink  Psych:  No HI/SI, judgement and insight good, Euthymic mood. Full Affect.    Recent Results (from the past 2160 hour(s))  T4, free     Status: None   Collection Time: 06/23/18  9:01 AM  Result Value Ref Range   Free T4 1.15 0.82 - 1.77 ng/dL  Comprehensive metabolic panel     Status: None   Collection Time: 06/23/18  9:01 AM  Result Value Ref Range   Glucose 94 65 - 99 mg/dL   BUN 12 6 - 24 mg/dL   Creatinine, Ser 0.66 0.57 - 1.00 mg/dL   GFR calc non Af Amer 110 >59 mL/min/1.73   GFR calc Af Amer 127 >59 mL/min/1.73   BUN/Creatinine Ratio 18 9 - 23   Sodium 142 134 - 144 mmol/L   Potassium 4.4 3.5 - 5.2 mmol/L   Chloride 104 96 - 106 mmol/L   CO2 24 20 - 29 mmol/L   Calcium 9.0 8.7 - 10.2 mg/dL   Total Protein 6.9 6.0 - 8.5 g/dL   Albumin 4.3 3.5 - 5.5 g/dL   Globulin, Total 2.6 1.5 - 4.5 g/dL   Albumin/Globulin Ratio 1.7 1.2 - 2.2   Bilirubin Total 0.4 0.0 - 1.2 mg/dL   Alkaline Phosphatase 69 39 - 117 IU/L   AST 18 0 - 40 IU/L   ALT 21 0 - 32 IU/L  Lipid panel     Status: Abnormal   Collection Time: 06/23/18  9:01 AM  Result Value Ref Range   Cholesterol, Total 174 100 - 199 mg/dL   Triglycerides 265 (H) 0 - 149 mg/dL   HDL 39 (L) >39 mg/dL   VLDL Cholesterol Cal 53 (H) 5 - 40 mg/dL   LDL Calculated 82 0 - 99 mg/dL   Chol/HDL Ratio 4.5 (H) 0.0 - 4.4 ratio    Comment:  T. Chol/HDL Ratio                                             Men  Women                                1/2 Avg.Risk  3.4    3.3                                   Avg.Risk  5.0    4.4                                2X Avg.Risk  9.6    7.1                                3X Avg.Risk 23.4   11.0   VITAMIN D 25 Hydroxy (Vit-D Deficiency, Fractures)     Status: Abnormal   Collection Time: 06/23/18  9:01 AM  Result Value Ref Range   Vit D, 25-Hydroxy 21.7 (L) 30.0 - 100.0 ng/mL    Comment: Vitamin D deficiency has been defined by the [place] practice guideline as a level of serum 25-OH vitamin D less than 20 ng/mL (1,2). The Endocrine Society went on to further define vitamin D insufficiency as a level between 21 and 29 ng/mL (2). 1. IOM (Institute of Medicine). 2010. Dietary reference    intakes for calcium and D. [place]: The    [employer]. 2. Holick MF, Binkley Isabela, Bischoff-Ferrari HA, et al.    Evaluation, treatment, and prevention of vitamin D    deficiency: an Endocrine Society clinical practice    guideline. JCEM. 2011 Jul; 96(7):1911-30.   TSH     Status: None   Collection Time: 06/23/18  9:01 AM  Result Value Ref Range   TSH 1.410 0.450 - 4.500 uIU/mL  Hemoglobin A1c     Status: None   Collection Time: 06/23/18  9:01 AM  Result Value Ref Range   Hgb A1c MFr Bld 5.5 4.8 - 5.6 %    Comment:          Prediabetes: 5.7 - 6.4          Diabetes: >6.4          Glycemic control for adults with diabetes: <7.0    Est. average glucose Bld gHb Est-mCnc 111 mg/dL  CBC with Differential/Platelet     Status: None   Collection Time: 06/23/18  9:01 AM  Result Value Ref Range   WBC 6.3 3.4 - 10.8 x10E3/uL   RBC 4.71 3.77 - 5.28 x10E6/uL   Hemoglobin 14.2 11.1 - 15.9 g/dL   Hematocrit 41.8 34.0 - 46.6 %   MCV 89 79 - 97 fL   MCH 30.1 26.6 - 33.0 pg   MCHC 34.0 31.5 - 35.7 g/dL   RDW 12.8 12.3 - 15.4 %   Platelets 307 150 - 450 x10E3/uL   Neutrophils 58 Not Estab. %   Lymphs 33 Not Estab. %   Monocytes 7  Not Estab. %   Eos 1 Not  Estab. %   Basos 1 Not Estab. %   Neutrophils Absolute 3.6 1.4 - 7.0 x10E3/uL   Lymphocytes Absolute 2.1 0.7 - 3.1 x10E3/uL   Monocytes Absolute 0.4 0.1 - 0.9 x10E3/uL   EOS (ABSOLUTE) 0.1 0.0 - 0.4 x10E3/uL   Basophils Absolute 0.0 0.0 - 0.2 x10E3/uL   Immature Granulocytes 0 Not Estab. %   Immature Grans (Abs) 0.0 0.0 - 0.1 x10E3/uL

## 2018-05-13 NOTE — Patient Instructions (Signed)

## 2018-05-17 MED FILL — ESCITALOPRAM 10 MG TABLET: 10 | 30 days supply | Qty: 30 | Fill #1

## 2018-05-17 MED FILL — ATORVASTATIN 10 MG TABLET: 10 | 30 days supply | Qty: 30 | Fill #3

## 2018-06-10 MED FILL — ESCITALOPRAM 10 MG TABLET: 10 | 30 days supply | Qty: 30 | Fill #2

## 2018-06-23 ENCOUNTER — Other Ambulatory Visit: Payer: 59

## 2018-06-23 DIAGNOSIS — J3089 Other allergic rhinitis: Secondary | ICD-10-CM | POA: Diagnosis not present

## 2018-06-23 DIAGNOSIS — F4323 Adjustment disorder with mixed anxiety and depressed mood: Secondary | ICD-10-CM | POA: Diagnosis not present

## 2018-06-23 DIAGNOSIS — R002 Palpitations: Secondary | ICD-10-CM

## 2018-06-23 DIAGNOSIS — E785 Hyperlipidemia, unspecified: Secondary | ICD-10-CM | POA: Diagnosis not present

## 2018-06-23 MED FILL — ATORVASTATIN 10 MG TABLET: 10 | 30 days supply | Qty: 30 | Fill #0

## 2018-06-24 LAB — COMPREHENSIVE METABOLIC PANEL
A/G RATIO: 1.7 (ref 1.2–2.2)
ALBUMIN: 4.3 g/dL (ref 3.5–5.5)
ALT: 21 IU/L (ref 0–32)
AST: 18 IU/L (ref 0–40)
Alkaline Phosphatase: 69 IU/L (ref 39–117)
BILIRUBIN TOTAL: 0.4 mg/dL (ref 0.0–1.2)
BUN / CREAT RATIO: 18 (ref 9–23)
BUN: 12 mg/dL (ref 6–24)
CHLORIDE: 104 mmol/L (ref 96–106)
CO2: 24 mmol/L (ref 20–29)
Calcium: 9 mg/dL (ref 8.7–10.2)
Creatinine, Ser: 0.66 mg/dL (ref 0.57–1.00)
GFR calc non Af Amer: 110 mL/min/{1.73_m2} (ref >59)
GFR, EST AFRICAN AMERICAN: 127 mL/min/{1.73_m2} (ref >59)
Globulin, Total: 2.6 g/dL (ref 1.5–4.5)
Glucose: 94 mg/dL (ref 65–99)
Potassium: 4.4 mmol/L (ref 3.5–5.2)
SODIUM: 142 mmol/L (ref 134–144)
TOTAL PROTEIN: 6.9 g/dL (ref 6.0–8.5)

## 2018-06-24 LAB — CBC WITH DIFFERENTIAL/PLATELET
Basophils Absolute: 0 10*3/uL (ref 0.0–0.2)
Basos: 1 %
EOS (ABSOLUTE): 0.1 10*3/uL (ref 0.0–0.4)
Eos: 1 %
Hematocrit: 41.8 % (ref 34.0–46.6)
Hemoglobin: 14.2 g/dL (ref 11.1–15.9)
Immature Grans (Abs): 0 10*3/uL (ref 0.0–0.1)
Immature Granulocytes: 0 %
LYMPHS ABS: 2.1 10*3/uL (ref 0.7–3.1)
Lymphs: 33 %
MCH: 30.1 pg (ref 26.6–33.0)
MCHC: 34 g/dL (ref 31.5–35.7)
MCV: 89 fL (ref 79–97)
Monocytes Absolute: 0.4 10*3/uL (ref 0.1–0.9)
Monocytes: 7 %
NEUTROS ABS: 3.6 10*3/uL (ref 1.4–7.0)
Neutrophils: 58 %
PLATELETS: 307 10*3/uL (ref 150–450)
RBC: 4.71 x10E6/uL (ref 3.77–5.28)
RDW: 12.8 % (ref 12.3–15.4)
WBC: 6.3 10*3/uL (ref 3.4–10.8)

## 2018-06-24 LAB — HEMOGLOBIN A1C
ESTIMATED AVERAGE GLUCOSE: 111 mg/dL
HEMOGLOBIN A1C: 5.5 % (ref 4.8–5.6)

## 2018-06-24 LAB — LIPID PANEL
Chol/HDL Ratio: 4.5 ratio — ABNORMAL HIGH (ref 0.0–4.4)
Cholesterol, Total: 174 mg/dL (ref 100–199)
HDL: 39 mg/dL — ABNORMAL LOW (ref >39)
LDL Calculated: 82 mg/dL (ref 0–99)
Triglycerides: 265 mg/dL — ABNORMAL HIGH (ref 0–149)
VLDL Cholesterol Cal: 53 mg/dL — ABNORMAL HIGH (ref 5–40)

## 2018-06-24 LAB — T4, FREE: FREE T4: 1.15 ng/dL (ref 0.82–1.77)

## 2018-06-24 LAB — TSH: TSH: 1.41 u[IU]/mL (ref 0.450–4.500)

## 2018-06-24 LAB — VITAMIN D 25 HYDROXY (VIT D DEFICIENCY, FRACTURES): VIT D 25 HYDROXY: 21.7 ng/mL — AB (ref 30.0–100.0)

## 2018-06-29 ENCOUNTER — Encounter: Payer: 59 | Admitting: Family Medicine

## 2018-07-19 ENCOUNTER — Encounter: Payer: Self-pay | Admitting: Family Medicine

## 2018-07-19 ENCOUNTER — Ambulatory Visit (INDEPENDENT_AMBULATORY_CARE_PROVIDER_SITE_OTHER): Payer: 59 | Admitting: Family Medicine

## 2018-07-19 VITALS — BP 112/73 | HR 65 | Ht 65.0 in | Wt 194.1 lb

## 2018-07-19 DIAGNOSIS — R635 Abnormal weight gain: Secondary | ICD-10-CM | POA: Diagnosis not present

## 2018-07-19 DIAGNOSIS — Z719 Counseling, unspecified: Secondary | ICD-10-CM

## 2018-07-19 DIAGNOSIS — F39 Unspecified mood [affective] disorder: Secondary | ICD-10-CM | POA: Diagnosis not present

## 2018-07-19 DIAGNOSIS — E786 Lipoprotein deficiency: Secondary | ICD-10-CM

## 2018-07-19 DIAGNOSIS — R6882 Decreased libido: Secondary | ICD-10-CM | POA: Diagnosis not present

## 2018-07-19 DIAGNOSIS — E785 Hyperlipidemia, unspecified: Secondary | ICD-10-CM

## 2018-07-19 DIAGNOSIS — E781 Pure hyperglyceridemia: Secondary | ICD-10-CM | POA: Diagnosis not present

## 2018-07-19 DIAGNOSIS — Z0000 Encounter for general adult medical examination without abnormal findings: Secondary | ICD-10-CM

## 2018-07-19 DIAGNOSIS — T50905A Adverse effect of unspecified drugs, medicaments and biological substances, initial encounter: Secondary | ICD-10-CM

## 2018-07-19 MED ORDER — BUPROPION HCL 75 MG PO TABS: 75.0000 mg | ORAL_TABLET | Freq: Two times a day (BID) | ORAL | 0 refills | Status: DC

## 2018-07-19 MED FILL — buPROPion HCL 75 MG TABS: 75 | 90 days supply | Qty: 180 | Fill #0

## 2018-07-19 NOTE — Progress Notes (Signed)
Impression and Recommendations:    1. Encounter for wellness examination   2. Health education/counseling   3. Mood disorder ([place])- mainly anxiety   4. Low libido   5. Weight gain due to medication   6. Hyperlipidemia, unspecified hyperlipidemia type   7. Hypertriglyceridemia   8. Low HDL (under 40)     1) Anticipatory Guidance: Discussed importance of wearing a seatbelt while driving, not texting while driving; sunscreen when outside along with yearly skin surveillance; eating a well balanced and modest diet; physical activity at least 25 minutes per day or 150 min/ week of moderate to intense activity.  - Reviewed prudent skin screening habits to practice at home.  2) Immunizations / Screenings / Labs: All immunizations and screenings that patient agrees to, are up-to-date per recommendations or will be updated today.  Patient understands the needs for q 75mo dental and yearly vision screens which pt will schedule independently. Obtain CBC, CMP, HgA1c, Lipid panel, TSH and vit D when fasting if not already done recently.   - Educated patient about importance of routine screening for HIV/Hepatitis C.  3) Mood disorder ([place]) - mainly anxiety - Weight gain due to medication - Symptoms well-controlled on Lexapro, but patient is concerned about associated weight gain.  Recommended change in treatment plan today.  See med list below. - Discussed weaning off Lexapro and on to Wellbutrin at length with patient. - Patient understands how to titrate her dose of medicine.  - Reviewed potential side-effects of changing mood medication with patient.  - Begin exercise for stress management.  - Reviewed the importance of addressing every "spoke of the wheel" of mood management along with her use of medication, including mindfulness/medittaion, exercise, social support, adequate sleep, prudent diet, counseling/life coach.  4) Low libido - Education provided regarding low libido today.  Discussed that change in mood management can assist with improvement of libido, including addressing all areas of emotional health.  - Encouraged importance of exercise, prudent diet, adequate sleep, and taking time to establish a date night or other "couples time" with partner.  5) Hyperlipidemia, unspecified hyperlipidemia type - Education provided regarding hyperlipidemia today.   - Discussed diet low in cholesterol, the importance of exercise, and provided handouts as needed.  6) Hypertriglyceridemia - Education given regarding triglycerides.   - Discussed diet low in fatty carbs, exercise.  Handouts provided.  7) Low HDL (under 40) - Education regarding low HDL given today.  Discussed importance of exercise and prudent diet.  8) Weight: Discussed goal of losing even 5-10% of current body weight which would improve overall feelings of well being and improve objective health data significantly.   Improve nutrient density of diet through increasing intake of fruits and vegetables and decreasing saturated/trans fats, white flour products and refined sugar products.   9) BMI Counseling Explained to patient what BMI refers to, and what it means medically.  Told patient to think about it as a "medical risk stratification measurement" and how increasing BMI is associated with increasing risk/ or worsening state of various diseases such as hypertension, hyperlipidemia, diabetes, premature OA, depression etc.  American Heart Association guidelines for healthy diet, basically Mediterranean diet, and exercise guidelines of 30 minutes 5 days per week or more discussed in detail.  Health counseling performed.  All questions answered.  10) Lifestyle & Preventative Health Maintenance - Advised patient to continue working toward exercising to improve overall mental, physical, and emotional health.    - Encouraged patient to engage  in daily physical activity, especially a formal exercise routine.   Recommended that the patient eventually strive for at least 150 minutes of moderate cardiovascular activity per week according to guidelines established by the [facility].   - Healthy dietary habits encouraged, including low-carb, and high amounts of lean protein in diet.   - Patient should also consume adequate amounts of water - half of body weight in oz of water per day.  11) Follow-Up - Prescriptions refilled today as needed. - Re-check fasting lab work as recommended. - Otherwise, continue to return for CPE and chronic follow-up as scheduled.   - Patient knows to call in if desired to address acute concerns, diet, exercise, or other health goals.  Return for f/up 6-8wks for new med changes- d/c lexapro and onto wellbutrin.   Gross side effects, risk and benefits, and alternatives of medications discussed with patient.  Patient is aware that all medications have potential side effects and we are unable to predict every side effect or drug-drug interaction that may occur.  Expresses verbal understanding and consents to current therapy plan and treatment regimen.  F-up preventative CPE in 1 year. F/up sooner for chronic care management as discussed and/or prn.  Please see orders placed and AVS handed out to patient at the end of our visit for further patient instructions/ counseling done pertaining to today's office visit.  This document serves as a record of services personally performed by Mellody Dance, DO. It was created on her behalf by Toni Amend, a trained medical scribe. The creation of this record is based on the scribe's personal observations and the provider's statements to them.   I have reviewed the above medical documentation for accuracy and completeness and I concur.  Mellody Dance 07/19/18 3:03 PM    Subjective:    Chief Complaint  Patient presents with  . Annual Exam   CC: Female Physical  HPI: Denise Strong is a 42 y.o. female who presents to [place] [facility] today a yearly health maintenance exam.  Health Maintenance Summary Reviewed and updated, unless pt declines services.  Colonoscopy:  Unnecessary secondary to < 57 years old. Denies family history of colon cancer. Tobacco History Reviewed:   Yes; quit 02/20/1995. Alcohol:    No concerns, no excessive use. Exercise Habits:   Not meeting AHA guidelines STD concerns:   None; monogamous with wife. Notes that Lexapro has killed her sex drive. Patient has never been screened for HIV or Hepatitis C. Drug Use:   None. Birth control method:   N/a. Menses regular:     N/a.  Lumps or breast concerns:    No Breast Cancer Family History:    No  Kids' birthday is tomorrow.  Took them to the beach for a surprise trip and having a family gathering tomorrow.  Work has been good, but busy.  She and spouse are going to [facility] next weekend to celebrate 20 years together.  They do not usually have an established date night.  Patient denies issues feeling focused.  Patient is not exercising formally.  Continues eating fruits and veggies for snacks at work.  Bowels are regular.  Denies constipation or diarrhea.  States she's not getting enough fiber in her diet.  On days she works, feels she gets less than 8 ounces of water.  Eye Health Patient goes once per year to have her eyes checked.  OBGYN Follows up with OBGYN.  Patient was prescribed Lexapro through the  OBGYN. Patient was told that she was super low risk for HPV and "everything came back fine."  Mood Patient is on Lexapro due to anxiety. Denies social anxiety. She feels she has experienced weight gain on this drug. Patient feels that she's gained a minimum of 15 lbs without change in diet or activity level.  Notes "it's not like I've been eating donuts."  Also has concerns about her libido today.  She feels it has been low.  And possibly worse since starting the med  Denies concerns with depression  or sleep.  Doesn't have a trigger; just life pressures and work that affect her.  Notes that she has had three panic attacks in her life.   Immunization History  Administered Date(s) Administered  . Influenza-Unspecified 07/12/2013  . Tdap 12/16/2005    Health Maintenance  Topic Date Due  . PAP SMEAR  07/19/2018 (Originally 01/27/1998)  . INFLUENZA VACCINE  09/09/2018 (Originally 05/12/2018)  . TETANUS/TDAP  05/14/2019 (Originally 12/17/2015)  . HIV Screening  05/14/2019 (Originally 01/28/1992)     Wt Readings from Last 3 Encounters:  07/19/18 194 lb 1.6 oz (88 kg)  05/13/18 196 lb (88.9 kg)  03/24/16 177 lb (80.3 kg)   BP Readings from Last 3 Encounters:  07/19/18 112/73  05/13/18 116/76  03/24/16 113/64   Pulse Readings from Last 3 Encounters:  07/19/18 65  05/13/18 74  03/24/16 78     Past Medical History:  Diagnosis Date  . Chest pain 02/20/2016  . Hyperlipidemia 02/20/2016  . Palpitations 02/20/2016  . Renal disorder     GAD 7 : Generalized Anxiety Score 07/19/2018  Nervous, Anxious, on Edge 0  Control/stop worrying 0  Worry too much - different things 0  Trouble relaxing 0  Restless 0  Easily annoyed or irritable 0  Afraid - awful might happen 0  Total GAD 7 Score 0  Anxiety Difficulty Not difficult at all    Past Surgical History:  Procedure Laterality Date  . APPENDECTOMY    . CHOLECYSTECTOMY        Family History  Problem Relation Age of Onset  . Diabetes Mother   . Hypertension Father   . Stroke Father   . Diabetes Brother   . Diabetes Maternal Grandmother   . AAA (abdominal aortic aneurysm) Maternal Grandfather   . Dementia Paternal Grandfather       Social History   Substance and Sexual Activity  Drug Use No  ,   Social History   Substance and Sexual Activity  Alcohol Use No  . Alcohol/week: 0.0 standard drinks  ,   Social History   Tobacco Use  Smoking Status Former Smoker  . Last attempt to quit: 02/20/1995  . Years  since quitting: 23.4  Smokeless Tobacco Never Used  Tobacco Comment   social; in college  ,   Social History   Substance and Sexual Activity  Sexual Activity Not on file    Current Outpatient Medications on File Prior to Visit  Medication Sig Dispense Refill  . atorvastatin (LIPITOR) 10 MG tablet Take 10 mg by mouth daily.  3  . loratadine (CLARITIN) 10 MG tablet Take 10 mg by mouth daily.     No current facility-administered medications on file prior to visit.     Allergies: Ceclor [cefaclor]; Erythromycin; Iodine; and Penicillins  Review of Systems: General:   Denies fever, chills, unexplained weight loss.  Optho/Auditory:   Denies visual changes, blurred vision/LOV Respiratory:   Denies SOB, DOE more  than baseline levels.  Cardiovascular:   Denies chest pain, palpitations, new onset peripheral edema  Gastrointestinal:   Denies nausea, vomiting, diarrhea.  Genitourinary: Denies dysuria, freq/ urgency, flank pain or discharge from genitals.  Endocrine:     Denies hot or cold intolerance, polyuria, polydipsia. Musculoskeletal:   Denies unexplained myalgias, joint swelling, unexplained arthralgias, gait problems.  Skin:  Denies rash, suspicious lesions Neurological:     Denies dizziness, unexplained weakness, numbness  Psychiatric/Behavioral:   Denies mood changes, suicidal or homicidal ideations, hallucinations    Objective:    Blood pressure 112/73, pulse 65, height 5\' 5"  (1.651 m), weight 194 lb 1.6 oz (88 kg), SpO2 98 %. Body mass index is 32.3 kg/m. General Appearance:    Alert, cooperative, no distress, appears stated age  Head:    Normocephalic, without obvious abnormality, atraumatic  Eyes:    PERRL, conjunctiva/corneas clear, EOM's intact, fundi    benign, both eyes  Ears:    Normal TM's and external ear canals, both ears  Nose:   Nares normal, septum midline, mucosa normal, no drainage    or sinus tenderness  Throat:   Lips w/o lesion, mucosa moist, and  tongue normal; teeth and   gums normal  Neck:   Supple, symmetrical, trachea midline, no adenopathy;    thyroid:  no enlargement/tenderness/nodules; no carotid   bruit or JVD  Back:     Symmetric, no curvature, ROM normal, no CVA tenderness  Lungs:     Clear to auscultation bilaterally, respirations unlabored, no       Wh/ R/ R  Chest Wall:    No tenderness or gross deformity; normal excursion   Heart:    Regular rate and rhythm, S1 and S2 normal, no murmur, rub   or gallop  Breast Exam:   Deferred to gyn  Abdomen:     Soft, non-tender, bowel sounds active all four quadrants, NO   G/R/R, no masses, no organomegaly  Genitalia:    Deferred.  Patient follows up with OBGYN.  Rectal:    Deferred. Patient follows up with OBGYN.  Extremities:   Extremities normal, atraumatic, no cyanosis or gross edema  Pulses:   2+ and symmetric all extremities  Skin:   Warm, dry, Skin color, texture, turgor normal, no obvious rashes or lesions Psych: No HI/SI, judgement and insight good, Euthymic mood. Full Affect.  Neurologic:   CNII-XII intact, normal strength, sensation and reflexes    Throughout

## 2018-07-19 NOTE — Patient Instructions (Addendum)
We are slowly weaning you off Lexapro due to side effect of weight gain and sexual dysfunction and adding you on the Wellbutrin.  Please go to 1/2 tablet of the Lexapro daily and take 1- 0.5 tablets of the Wellbutrin twice daily.  Do this for 2 weeks then, go to 1/2 tablet of the Lexapro every other day for 2 weeks and up to 1 full tablet of the Wellbutrin twice daily, may increase to 2 tabs twice daily if tolerated.  Please let me know if you have any concerns or side effects. Also, I would like to see you in 6 to 8 weeks and a follow-up office visit to check to see how the mood is doing.  Below you will see information on triglycerides and also anxiety.    Preventive Care for Adults, Female  A healthy lifestyle and preventive care can promote health and wellness. Preventive health guidelines for women include the following key practices.   A routine yearly physical is a good way to check with your health care provider about your health and preventive screening. It is a chance to share any concerns and updates on your health and to receive a thorough exam.   Visit your dentist for a routine exam and preventive care every 6 months. Brush your teeth twice a day and floss once a day. Good oral hygiene prevents tooth decay and gum disease.   The frequency of eye exams is based on your age, health, family medical history, use of contact lenses, and other factors. Follow your health care provider's recommendations for frequency of eye exams.   Eat a healthy diet. Foods like vegetables, fruits, whole grains, low-fat dairy products, and lean protein foods contain the nutrients you need without too many calories. Decrease your intake of foods high in solid fats, added sugars, and salt. Eat the right amount of calories for you.Get information about a proper diet from your health care provider, if necessary.   Regular physical exercise is one of the most important things you can do for your health.  Most adults should get at least 150 minutes of moderate-intensity exercise (any activity that increases your heart rate and causes you to sweat) each week. In addition, most adults need muscle-strengthening exercises on 2 or more days a week.   Maintain a healthy weight. The body mass index (BMI) is a screening tool to identify possible weight problems. It provides an estimate of body fat based on height and weight. Your health care provider can find your BMI, and can help you achieve or maintain a healthy weight.For adults 20 years and older:   - A BMI below 18.5 is considered underweight.   - A BMI of 18.5 to 24.9 is normal.   - A BMI of 25 to 29.9 is considered overweight.   - A BMI of 30 and above is considered obese.   Maintain normal blood lipids and cholesterol levels by exercising and minimizing your intake of trans and saturated fats.  Eat a balanced diet with plenty of fruit and vegetables. Blood tests for lipids and cholesterol should begin at age 72 and be repeated every 5 years minimum.  If your lipid or cholesterol levels are high, you are over 40, or you are at high risk for heart disease, you may need your cholesterol levels checked more frequently.Ongoing high lipid and cholesterol levels should be treated with medicines if diet and exercise are not working.   If you smoke, find out from your health  care provider how to quit. If you do not use tobacco, do not start.   Lung cancer screening is recommended for adults aged 33-80 years who are at high risk for developing lung cancer because of a history of smoking. A yearly low-dose CT scan of the lungs is recommended for people who have at least a 30-pack-year history of smoking and are a current smoker or have quit within the past 15 years. A pack year of smoking is smoking an average of 1 pack of cigarettes a day for 1 year (for example: 1 pack a day for 30 years or 2 packs a day for 15 years). Yearly screening should continue until  the smoker has stopped smoking for at least 15 years. Yearly screening should be stopped for people who develop a health problem that would prevent them from having lung cancer treatment.   If you are pregnant, do not drink alcohol. If you are breastfeeding, be very cautious about drinking alcohol. If you are not pregnant and choose to drink alcohol, do not have more than 1 drink per day. One drink is considered to be 12 ounces (355 mL) of beer, 5 ounces (148 mL) of wine, or 1.5 ounces (44 mL) of liquor.   Avoid use of street drugs. Do not share needles with anyone. Ask for help if you need support or instructions about stopping the use of drugs.   High blood pressure causes heart disease and increases the risk of stroke. Your blood pressure should be checked at least yearly.  Ongoing high blood pressure should be treated with medicines if weight loss and exercise do not work.   If you are 106-37 years old, ask your health care provider if you should take aspirin to prevent strokes.   Diabetes screening involves taking a blood sample to check your fasting blood sugar level. This should be done once every 3 years, after age 41, if you are within normal weight and without risk factors for diabetes. Testing should be considered at a younger age or be carried out more frequently if you are overweight and have at least 1 risk factor for diabetes.   Breast cancer screening is essential preventive care for women. You should practice "breast self-awareness."  This means understanding the normal appearance and feel of your breasts and may include breast self-examination.  Any changes detected, no matter how small, should be reported to a health care provider.  Women in their 34s and 30s should have a clinical breast exam (CBE) by a health care provider as part of a regular health exam every 1 to 3 years.  After age 31, women should have a CBE every year.  Starting at age 37, women should consider having a  mammogram (breast X-ray test) every year.  Women who have a family history of breast cancer should talk to their health care provider about genetic screening.  Women at a high risk of breast cancer should talk to their health care providers about having an MRI and a mammogram every year.   -Breast cancer gene (BRCA)-related cancer risk assessment is recommended for women who have family members with BRCA-related cancers. BRCA-related cancers include breast, ovarian, tubal, and peritoneal cancers. Having family members with these cancers may be associated with an increased risk for harmful changes (mutations) in the breast cancer genes BRCA1 and BRCA2. Results of the assessment will determine the need for genetic counseling and BRCA1 and BRCA2 testing.   The Pap test is a screening test  for cervical cancer. A Pap test can show cell changes on the cervix that might become cervical cancer if left untreated. A Pap test is a procedure in which cells are obtained and examined from the lower end of the uterus (cervix).   - Women should have a Pap test starting at age 100.   - Between ages 72 and 60, Pap tests should be repeated every 2 years.   - Beginning at age 74, you should have a Pap test every 3 years as long as the past 3 Pap tests have been normal.   - Some women have medical problems that increase the chance of getting cervical cancer. Talk to your health care provider about these problems. It is especially important to talk to your health care provider if a new problem develops soon after your last Pap test. In these cases, your health care provider may recommend more frequent screening and Pap tests.   - The above recommendations are the same for women who have or have not gotten the vaccine for human papillomavirus (HPV).   - If you had a hysterectomy for a problem that was not cancer or a condition that could lead to cancer, then you no longer need Pap tests. Even if you no longer need a Pap  test, a regular exam is a good idea to make sure no other problems are starting.   - If you are between ages 82 and 22 years, and you have had normal Pap tests going back 10 years, you no longer need Pap tests. Even if you no longer need a Pap test, a regular exam is a good idea to make sure no other problems are starting.   - If you have had past treatment for cervical cancer or a condition that could lead to cancer, you need Pap tests and screening for cancer for at least 20 years after your treatment.   - If Pap tests have been discontinued, risk factors (such as a new sexual partner) need to be reassessed to determine if screening should be resumed.   - The HPV test is an additional test that may be used for cervical cancer screening. The HPV test looks for the virus that can cause the cell changes on the cervix. The cells collected during the Pap test can be tested for HPV. The HPV test could be used to screen women aged 30 years and older, and should be used in women of any age who have unclear Pap test results. After the age of 68, women should have HPV testing at the same frequency as a Pap test.   Colorectal cancer can be detected and often prevented. Most routine colorectal cancer screening begins at the age of 59 years and continues through age 71 years. However, your health care provider may recommend screening at an earlier age if you have risk factors for colon cancer. On a yearly basis, your health care provider may provide home test kits to check for hidden blood in the stool.  Use of a small camera at the end of a tube, to directly examine the colon (sigmoidoscopy or colonoscopy), can detect the earliest forms of colorectal cancer. Talk to your health care provider about this at age 66, when routine screening begins. Direct exam of the colon should be repeated every 5 -10 years through age 56 years, unless early forms of pre-cancerous polyps or small growths are found.   People who  are at an increased risk for hepatitis B  should be screened for this virus. You are considered at high risk for hepatitis B if:  -You were born in a country where hepatitis B occurs often. Talk with your health care provider about which countries are considered high risk.  - Your parents were born in a high-risk country and you have not received a shot to protect against hepatitis B (hepatitis B vaccine).  - You have HIV or AIDS.  - You use needles to inject street drugs.  - You live with, or have sex with, someone who has Hepatitis B.  - You get hemodialysis treatment.  - You take certain medicines for conditions like cancer, organ transplantation, and autoimmune conditions.   Hepatitis C blood testing is recommended for all people born from [birth year] through 1964 and any individual with known risks for hepatitis C.   Practice safe sex. Use condoms and avoid high-risk sexual practices to reduce the spread of sexually transmitted infections (STIs). STIs include gonorrhea, chlamydia, syphilis, trichomonas, herpes, HPV, and human immunodeficiency virus (HIV). Herpes, HIV, and HPV are viral illnesses that have no cure. They can result in disability, cancer, and death. Sexually active women aged 61 years and younger should be checked for chlamydia. Older women with new or multiple partners should also be tested for chlamydia. Testing for other STIs is recommended if you are sexually active and at increased risk.   Osteoporosis is a disease in which the bones lose minerals and strength with aging. This can result in serious bone fractures or breaks. The risk of osteoporosis can be identified using a bone density scan. Women ages 69 years and over and women at risk for fractures or osteoporosis should discuss screening with their health care providers. Ask your health care provider whether you should take a calcium supplement or vitamin D to There are also several preventive steps women can take to avoid  osteoporosis and resulting fractures or to keep osteoporosis from worsening. -->Recommendations include:  Eat a balanced diet high in fruits, vegetables, calcium, and vitamins.  Get enough calcium. The recommended total intake of is 1,200 mg daily; for best absorption, if taking supplements, divide doses into 250-500 mg doses throughout the day. Of the two types of calcium, calcium carbonate is best absorbed when taken with food but calcium citrate can be taken on an empty stomach.  Get enough vitamin D. [employer] and the [place] recommend at least 1,000 IU per day for women age 22 and over who are at risk of vitamin D deficiency. Vitamin D deficiency can be caused by inadequate sun exposure (for example, those who live in [place]).  Avoid alcohol and smoking. Heavy alcohol intake (more than 7 drinks per week) increases the risk of falls and hip fracture and women smokers tend to lose bone more rapidly and have lower bone mass than nonsmokers. Stopping smoking is one of the most important changes women can make to improve their health and decrease risk for disease.  Be physically active every day. Weight-bearing exercise (for example, fast walking, hiking, jogging, and weight training) may strengthen bones or slow the rate of bone loss that comes with aging. Balancing and muscle-strengthening exercises can reduce the risk of falling and fracture.  Consider therapeutic medications. Currently, several types of effective drugs are available. Healthcare providers can recommend the type most appropriate for each woman.  Eliminate environmental factors that may contribute to accidents. Falls cause nearly 90% of all osteoporotic fractures, so reducing this risk is an important bone-health  strategy. Measures include ample lighting, removing obstructions to walking, using nonskid rugs on floors, and placing mats and/or grab bars in showers.  Be aware of medication side effects.  Some common medicines make bones weaker. These include a type of steroid drug called glucocorticoids used for arthritis and asthma, some antiseizure drugs, certain sleeping pills, treatments for endometriosis, and some cancer drugs. An overactive thyroid gland or using too much thyroid hormone for an underactive thyroid can also be a problem. If you are taking these medicines, talk to your doctor about what you can do to help protect your bones.reduce the rate of osteoporosis.    Menopause can be associated with physical symptoms and risks. Hormone replacement therapy is available to decrease symptoms and risks. You should talk to your health care provider about whether hormone replacement therapy is right for you.   Use sunscreen. Apply sunscreen liberally and repeatedly throughout the day. You should seek shade when your shadow is shorter than you. Protect yourself by wearing long sleeves, pants, a wide-brimmed hat, and sunglasses year round, whenever you are outdoors.   Once a month, do a whole body skin exam, using a mirror to look at the skin on your back. Tell your health care provider of new moles, moles that have irregular borders, moles that are larger than a pencil eraser, or moles that have changed in shape or color.   -Stay current with required vaccines (immunizations).   Influenza vaccine. All adults should be immunized every year.  Tetanus, diphtheria, and acellular pertussis (Td, Tdap) vaccine. Pregnant women should receive 1 dose of Tdap vaccine during each pregnancy. The dose should be obtained regardless of the length of time since the last dose. Immunization is preferred during the 27th 36th week of gestation. An adult who has not previously received Tdap or who does not know her vaccine status should receive 1 dose of Tdap. This initial dose should be followed by tetanus and diphtheria toxoids (Td) booster doses every 10 years. Adults with an unknown or incomplete history of  completing a 3-dose immunization series with Td-containing vaccines should begin or complete a primary immunization series including a Tdap dose. Adults should receive a Td booster every 10 years.  Varicella vaccine. An adult without evidence of immunity to varicella should receive 2 doses or a second dose if she has previously received 1 dose. Pregnant females who do not have evidence of immunity should receive the first dose after pregnancy. This first dose should be obtained before leaving the health care facility. The second dose should be obtained 4 8 weeks after the first dose.  Human papillomavirus (HPV) vaccine. Females aged 1 26 years who have not received the vaccine previously should obtain the 3-dose series. The vaccine is not recommended for use in pregnant females. However, pregnancy testing is not needed before receiving a dose. If a female is found to be pregnant after receiving a dose, no treatment is needed. In that case, the remaining doses should be delayed until after the pregnancy. Immunization is recommended for any person with an immunocompromised condition through the age of 81 years if she did not get any or all doses earlier. During the 3-dose series, the second dose should be obtained 4 8 weeks after the first dose. The third dose should be obtained 24 weeks after the first dose and 16 weeks after the second dose.  Zoster vaccine. One dose is recommended for adults aged 58 years or older unless certain conditions are present.  Measles, mumps, and rubella (MMR) vaccine. Adults born before [birth year] generally are considered immune to measles and mumps. Adults born in [birth year] or later should have 1 or more doses of MMR vaccine unless there is a contraindication to the vaccine or there is laboratory evidence of immunity to each of the three diseases. A routine second dose of MMR vaccine should be obtained at least 28 days after the first dose for students attending postsecondary schools,  health care workers, or international travelers. People who received inactivated measles vaccine or an unknown type of measles vaccine during 1963 1967 should receive 2 doses of MMR vaccine. People who received inactivated mumps vaccine or an unknown type of mumps vaccine before 1979 and are at high risk for mumps infection should consider immunization with 2 doses of MMR vaccine. For females of childbearing age, rubella immunity should be determined. If there is no evidence of immunity, females who are not pregnant should be vaccinated. If there is no evidence of immunity, females who are pregnant should delay immunization until after pregnancy. Unvaccinated health care workers born before [birth year] who lack laboratory evidence of measles, mumps, or rubella immunity or laboratory confirmation of disease should consider measles and mumps immunization with 2 doses of MMR vaccine or rubella immunization with 1 dose of MMR vaccine.  Pneumococcal 13-valent conjugate (PCV13) vaccine. When indicated, a person who is uncertain of her immunization history and has no record of immunization should receive the PCV13 vaccine. An adult aged 71 years or older who has certain medical conditions and has not been previously immunized should receive 1 dose of PCV13 vaccine. This PCV13 should be followed with a dose of pneumococcal polysaccharide (PPSV23) vaccine. The PPSV23 vaccine dose should be obtained at least 8 weeks after the dose of PCV13 vaccine. An adult aged 50 years or older who has certain medical conditions and previously received 1 or more doses of PPSV23 vaccine should receive 1 dose of PCV13. The PCV13 vaccine dose should be obtained 1 or more years after the last PPSV23 vaccine dose.  Pneumococcal polysaccharide (PPSV23) vaccine. When PCV13 is also indicated, PCV13 should be obtained first. All adults aged 72 years and older should be immunized. An adult younger than age 38 years who has certain medical conditions  should be immunized. Any person who resides in a nursing home or long-term care facility should be immunized. An adult smoker should be immunized. People with an immunocompromised condition and certain other conditions should receive both PCV13 and PPSV23 vaccines. People with human immunodeficiency virus (HIV) infection should be immunized as soon as possible after diagnosis. Immunization during chemotherapy or radiation therapy should be avoided. Routine use of PPSV23 vaccine is not recommended for American Indians, [place] Natives, or people younger than 65 years unless there are medical conditions that require PPSV23 vaccine. When indicated, people who have unknown immunization and have no record of immunization should receive PPSV23 vaccine. One-time revaccination 5 years after the first dose of PPSV23 is recommended for people aged 75 64 years who have chronic kidney failure, nephrotic syndrome, asplenia, or immunocompromised conditions. People who received 1 2 doses of PPSV23 before age 48 years should receive another dose of PPSV23 vaccine at age 40 years or later if at least 5 years have passed since the previous dose. Doses of PPSV23 are not needed for people immunized with PPSV23 at or after age 30 years.  Meningococcal vaccine. Adults with asplenia or persistent complement component deficiencies should receive 2 doses of quadrivalent meningococcal  conjugate (MenACWY-D) vaccine. The doses should be obtained at least 2 months apart. Microbiologists working with certain meningococcal bacteria, [place] recruits, people at risk during an outbreak, and people who travel to or live in countries with a high rate of meningitis should be immunized. A first-year college student up through age 32 years who is living in a residence hall should receive a dose if she did not receive a dose on or after her 16th birthday. Adults who have certain high-risk conditions should receive one or more doses of  vaccine.  Hepatitis A vaccine. Adults who wish to be protected from this disease, have certain high-risk conditions, work with hepatitis A-infected animals, work in hepatitis A research labs, or travel to or work in countries with a high rate of hepatitis A should be immunized. Adults who were previously unvaccinated and who anticipate close contact with an international adoptee during the first 60 days after arrival in the [place] from a country with a high rate of hepatitis A should be immunized.  Hepatitis B vaccine.  Adults who wish to be protected from this disease, have certain high-risk conditions, may be exposed to blood or other infectious body fluids, are household contacts or sex partners of hepatitis B positive people, are clients or workers in certain care facilities, or travel to or work in countries with a high rate of hepatitis B should be immunized.  Haemophilus influenzae type b (Hib) vaccine. A previously unvaccinated person with asplenia or sickle cell disease or having a scheduled splenectomy should receive 1 dose of Hib vaccine. Regardless of previous immunization, a recipient of a hematopoietic stem cell transplant should receive a 3-dose series 6 12 months after her successful transplant. Hib vaccine is not recommended for adults with HIV infection.  Preventive Services / Frequency Ages 54 to 39years  Blood pressure check.** / Every 1 to 2 years.  Lipid and cholesterol check.** / Every 5 years beginning at age 51.  Clinical breast exam.** / Every 3 years for women in their 42s and 48s.  BRCA-related cancer risk assessment.** / For women who have family members with a BRCA-related cancer (breast, ovarian, tubal, or peritoneal cancers).  Pap test.** / Every 2 years from ages 18 through 52. Every 3 years starting at age 19 through age 94 or 33 with a history of 3 consecutive normal Pap tests.  HPV screening.** / Every 3 years from ages 52 through ages 15 to 38 with a  history of 3 consecutive normal Pap tests.  Hepatitis C blood test.** / For any individual with known risks for hepatitis C.  Skin self-exam. / Monthly.  Influenza vaccine. / Every year.  Tetanus, diphtheria, and acellular pertussis (Tdap, Td) vaccine.** / Consult your health care provider. Pregnant women should receive 1 dose of Tdap vaccine during each pregnancy. 1 dose of Td every 10 years.  Varicella vaccine.** / Consult your health care provider. Pregnant females who do not have evidence of immunity should receive the first dose after pregnancy.  HPV vaccine. / 3 doses over 6 months, if 37 and younger. The vaccine is not recommended for use in pregnant females. However, pregnancy testing is not needed before receiving a dose.  Measles, mumps, rubella (MMR) vaccine.** / You need at least 1 dose of MMR if you were born in 1957 or later. You may also need a 2nd dose. For females of childbearing age, rubella immunity should be determined. If there is no evidence of immunity, females who are not pregnant  should be vaccinated. If there is no evidence of immunity, females who are pregnant should delay immunization until after pregnancy.  Pneumococcal 13-valent conjugate (PCV13) vaccine.** / Consult your health care provider.  Pneumococcal polysaccharide (PPSV23) vaccine.** / 1 to 2 doses if you smoke cigarettes or if you have certain conditions.  Meningococcal vaccine.** / 1 dose if you are age 47 to 68 years and a Market researcher living in a residence hall, or have one of several medical conditions, you need to get vaccinated against meningococcal disease. You may also need additional booster doses.  Hepatitis A vaccine.** / Consult your health care provider.  Hepatitis B vaccine.** / Consult your health care provider.  Haemophilus influenzae type b (Hib) vaccine.** / Consult your health care provider.  Ages 61 to 64years  Blood pressure check.** / Every 1 to 2 years.  Lipid  and cholesterol check.** / Every 5 years beginning at age 79 years.  Lung cancer screening. / Every year if you are aged 68 80 years and have a 30-pack-year history of smoking and currently smoke or have quit within the past 15 years. Yearly screening is stopped once you have quit smoking for at least 15 years or develop a health problem that would prevent you from having lung cancer treatment.  Clinical breast exam.** / Every year after age 58 years.  BRCA-related cancer risk assessment.** / For women who have family members with a BRCA-related cancer (breast, ovarian, tubal, or peritoneal cancers).  Mammogram.** / Every year beginning at age 56 years and continuing for as long as you are in good health. Consult with your health care provider.  Pap test.** / Every 3 years starting at age 34 years through age 82 or 66 years with a history of 3 consecutive normal Pap tests.  HPV screening.** / Every 3 years from ages 53 years through ages 2 to 63 years with a history of 3 consecutive normal Pap tests.  Fecal occult blood test (FOBT) of stool. / Every year beginning at age 67 years and continuing until age 67 years. You may not need to do this test if you get a colonoscopy every 10 years.  Flexible sigmoidoscopy or colonoscopy.** / Every 5 years for a flexible sigmoidoscopy or every 10 years for a colonoscopy beginning at age 79 years and continuing until age 2 years.  Hepatitis C blood test.** / For all people born from [birth year] through 1964 and any individual with known risks for hepatitis C.  Skin self-exam. / Monthly.  Influenza vaccine. / Every year.  Tetanus, diphtheria, and acellular pertussis (Tdap/Td) vaccine.** / Consult your health care provider. Pregnant women should receive 1 dose of Tdap vaccine during each pregnancy. 1 dose of Td every 10 years.  Varicella vaccine.** / Consult your health care provider. Pregnant females who do not have evidence of immunity should receive the  first dose after pregnancy.  Zoster vaccine.** / 1 dose for adults aged 64 years or older.  Measles, mumps, rubella (MMR) vaccine.** / You need at least 1 dose of MMR if you were born in 1956 or later. You may also need a 2nd dose. For females of childbearing age, rubella immunity should be determined. If there is no evidence of immunity, females who are not pregnant should be vaccinated. If there is no evidence of immunity, females who are pregnant should delay immunization until after pregnancy.  Pneumococcal 13-valent conjugate (PCV13) vaccine.** / Consult your health care provider.  Pneumococcal polysaccharide (PPSV23) vaccine.** / 1  to 2 doses if you smoke cigarettes or if you have certain conditions.  Meningococcal vaccine.** / Consult your health care provider.  Hepatitis A vaccine.** / Consult your health care provider.  Hepatitis B vaccine.** / Consult your health care provider.  Haemophilus influenzae type b (Hib) vaccine.** / Consult your health care provider.  Ages 45 years and over  Blood pressure check.** / Every 1 to 2 years.  Lipid and cholesterol check.** / Every 5 years beginning at age 63 years.  Lung cancer screening. / Every year if you are aged 37 80 years and have a 30-pack-year history of smoking and currently smoke or have quit within the past 15 years. Yearly screening is stopped once you have quit smoking for at least 15 years or develop a health problem that would prevent you from having lung cancer treatment.  Clinical breast exam.** / Every year after age 41 years.  BRCA-related cancer risk assessment.** / For women who have family members with a BRCA-related cancer (breast, ovarian, tubal, or peritoneal cancers).  Mammogram.** / Every year beginning at age 19 years and continuing for as long as you are in good health. Consult with your health care provider.  Pap test.** / Every 3 years starting at age 14 years through age 3 or 42 years with 3  consecutive normal Pap tests. Testing can be stopped between 65 and 70 years with 3 consecutive normal Pap tests and no abnormal Pap or HPV tests in the past 10 years.  HPV screening.** / Every 3 years from ages 47 years through ages 43 or 35 years with a history of 3 consecutive normal Pap tests. Testing can be stopped between 65 and 70 years with 3 consecutive normal Pap tests and no abnormal Pap or HPV tests in the past 10 years.  Fecal occult blood test (FOBT) of stool. / Every year beginning at age 82 years and continuing until age 55 years. You may not need to do this test if you get a colonoscopy every 10 years.  Flexible sigmoidoscopy or colonoscopy.** / Every 5 years for a flexible sigmoidoscopy or every 10 years for a colonoscopy beginning at age 34 years and continuing until age 59 years.  Hepatitis C blood test.** / For all people born from [birth year] through 1964 and any individual with known risks for hepatitis C.  Osteoporosis screening.** / A one-time screening for women ages 14 years and over and women at risk for fractures or osteoporosis.  Skin self-exam. / Monthly.  Influenza vaccine. / Every year.  Tetanus, diphtheria, and acellular pertussis (Tdap/Td) vaccine.** / 1 dose of Td every 10 years.  Varicella vaccine.** / Consult your health care provider.  Zoster vaccine.** / 1 dose for adults aged 31 years or older.  Pneumococcal 13-valent conjugate (PCV13) vaccine.** / Consult your health care provider.  Pneumococcal polysaccharide (PPSV23) vaccine.** / 1 dose for all adults aged 7 years and older.  Meningococcal vaccine.** / Consult your health care provider.  Hepatitis A vaccine.** / Consult your health care provider.  Hepatitis B vaccine.** / Consult your health care provider.  Haemophilus influenzae type b (Hib) vaccine.** / Consult your health care provider. ** Family history and personal history of risk and conditions may change your health care provider's  recommendations. Document Released: 11/24/2001 Document Revised: 07/19/2013  [facility] Patient Information 2014 [place], [place].   EXERCISE AND DIET:  We recommended that you start or continue a regular exercise program for good health. Regular exercise means any activity that  makes your heart beat faster and makes you sweat.  We recommend exercising at least 30 minutes per day at least 3 days a week, preferably 5.  We also recommend a diet low in fat and sugar / carbohydrates.  Inactivity, poor dietary choices and obesity can cause diabetes, heart attack, stroke, and kidney damage, among others.     ALCOHOL AND SMOKING:  Women should limit their alcohol intake to no more than 7 drinks/beers/glasses of wine (combined, not each!) per week. Moderation of alcohol intake to this level decreases your risk of breast cancer and liver damage.  ( And of course, no recreational drugs are part of a healthy lifestyle.)  Also, you should not be smoking at all or even being exposed to second hand smoke. Most people know smoking can cause cancer, and various heart and lung diseases, but did you know it also contributes to weakening of your bones?  Aging of your skin?  Yellowing of your teeth and nails?   CALCIUM AND VITAMIN D:  Adequate intake of calcium and Vitamin D are recommended.  The recommendations for exact amounts of these supplements seem to change often, but generally speaking 600 mg of calcium (either carbonate or citrate) and 800 units of Vitamin D per day seems prudent. Certain women may benefit from higher intake of Vitamin D.  If you are among these women, your doctor will have told you during your visit.     PAP SMEARS:  Pap smears, to check for cervical cancer or precancers,  have traditionally been done yearly, although recent scientific advances have shown that most women can have pap smears less often.  However, every woman still should have a physical exam from her gynecologist or primary care  physician every year. It will include a breast check, inspection of the vulva and vagina to check for abnormal growths or skin changes, a visual exam of the cervix, and then an exam to evaluate the size and shape of the uterus and ovaries.  And after 41 years of age, a rectal exam is indicated to check for rectal cancers. We will also provide age appropriate advice regarding health maintenance, like when you should have certain vaccines, screening for sexually transmitted diseases, bone density testing, colonoscopy, mammograms, etc.    MAMMOGRAMS:  All women over 77 years old should have a yearly mammogram. Many facilities now offer a "3D" mammogram, which may cost around $50 extra out of pocket. If possible,  we recommend you accept the option to have the 3D mammogram performed.  It both reduces the number of women who will be called back for extra views which then turn out to be normal, and it is better than the routine mammogram at detecting truly abnormal areas.     COLONOSCOPY:  Colonoscopy to screen for colon cancer is recommended for all women at age 52.  We know, you hate the idea of the prep.  We agree, BUT, having colon cancer and not knowing it is worse!!  Colon cancer so often starts as a polyp that can be seen and removed at colonscopy, which can quite literally save your life!  And if your first colonoscopy is normal and you have no family history of colon cancer, most women don't have to have it again for 10 years.  Once every ten years, you can do something that may end up saving your life, right?  We will be happy to help you get it scheduled when you are ready.  Be  sure to check your insurance coverage so you understand how much it will cost.  It may be covered as a preventative service at no cost, but you should check your particular policy.      The quick and dirty--> lower triglyceride levels more through...  1) - Beware of bad fats: Cutting back on saturated fat (in red meat and  full-fat dairy foods) and trans fats (in restaurant fried foods and commercially prepared baked goods) can lower triglycerides.  2) - Go for good carbs: Easily digested carbohydrates (such as white bread, white rice, cornflakes, and sugary sodas) give triglycerides a definite boost.   3) - Eating whole grains and cutting back on soda can help control triglycerides.  4) - Check your alcohol use. In some people, alcohol dramatically boosts triglycerides. The only way to know if this is true for you is to avoid alcohol for a few weeks and have your triglycerides tested again.  5) - Go fish. Omega-3 fats in salmon, tuna, sardines, and other fatty fish can lower triglycerides. Having fish twice a week is fine.  6) - Aim for a healthy weight. If you are overweight, losing just 5% to 10% of your weight can help drive down triglycerides.  7) - Get moving. Exercise lowers triglycerides and boosts heart-healthy HDL cholesterol.  8) - quit smoking if you do  --> for more information, see below; or go to  www.heart.org  and do a search for desired topics   For those diagnosed with high triglycerides, its important to take action to lower your levels and improve your heart health.  Triglyceride is just a fancy word for fat -- the fat in our bodies is stored in the form of triglycerides. Triglycerides are found in foods and manufactured in our bodies.  Normal triglyceride levels are defined as less than 150 mg/dL; 150 to 199 is considered borderline high; 200 to 499 is high; and 500 or higher is officially called very high. To me, anything over 150 is a red flag indicating my patient needs to take immediate steps to get the situation under control.   What is the significance of high triglycerides? High triglyceride levels make blood thicker and stickier, which means that it is more likely to form clots. Studies have shown that triglyceride levels are associated with increased risks of cardiovascular  disease and stroke -- in both men and women -- alone or in combination with other risk factors (high triglycerides combined with high LDL cholesterol can be a particularly deadly combination). For example, in one ground-breaking study, high triglycerides alone increased the risk of cardiovascular disease by 14 percent in men, and by 50 percent in women. But when the test subjects also had low HDL cholesterol (thats the good cholesterol) and other risk factors, high triglycerides increased the risk of disease by 32 percent in men and 76 percent in women.   Fortunately, triglycerides can sometimes be controlled with several diet and lifestyle changes.    What Factors Can Increase Triglycerides? As with cholesterol, eating too much of the wrong kinds of fats will raise your blood triglycerides.  Therefore, its important to restrict the amounts of saturated fats and trans fats you allow into your diet.  Triglyceride levels can also shoot up after eating foods that are high in carbohydrates or after drinking alcohol.  Thats why triglyceride blood tests require an overnight fast.  If you have elevated triglycerides, its especially important to avoid sugary and refined carbohydrates, including sugar, honey,  and other sweeteners, soda and other sugary drinks, candy, baked goods, and anything made with white (refined or enriched) flour, including white bread, rolls, cereals, buns, pastries, regular pasta, and white rice.  Youll also want to limit dried fruit and fruit juice since theyre dense in simple sugar.  All of these low-quality carbs cause a sudden rise in insulin, which may lead to a spike in triglycerides.  Triglycerides can also become elevated as a reaction to having diabetes, hypothyroidism, or kidney disease. As with most other heart-related factors, being overweight and inactive also contribute to abnormal triglycerides. And unfortunately, some people have a genetic predisposition that causes them  to manufacture way too much triglycerides on their own, no matter how carefully they eat.     How Can You Lower Your Triglyceride Levels? If you are diagnosed with high triglycerides, its important to take action. There are several things you can do to help lower your triglyceride levels and improve your heart health:  --> Lose weight if you are overweight.  There is a clear correlation between obesity and high triglycerides -- the heavier people are, the higher their triglyceride levels are likely to be. The good news is that losing weight can significantly lower triglycerides. In a large study of individuals with type 2 diabetes, those assigned to the lifestyle intervention group -- which involved counseling, a low-calorie meal plan, and customized exercise program -- lost 8.6% of their body weight and lowered their triglyceride levels by more than 16%. If youre overweight, find a weight loss plan that works for you and commit to shedding the pounds and getting healthier.  --> Reduce the amount of saturated fat and trans fat in your diet.  Start by avoiding or dramatically limiting butter, cream cheese, lard, sour cream, doughnuts, cakes, cookies, candy bars, regular ice cream, fried foods, pizza, cheese sauce, cream-based sauces and salad dressings, high-fat meats (including fatty hamburgers, bologna, pepperoni, sausage, bacon, salami, pastrami, spareribs, and hot dogs), high-fat cuts of beef and pork, and whole-milk dairy products.   Other ways to cut back: Choose lean meats only (including skinless chicken and [place], lean beef, lean pork), fish, and reduced-fat or fat-free dairy products.   Experiment with adding whole soy foods to your diet. Although soy itself may not reduce risk of heart disease, it replaces hazardous animal fats with healthier proteins. Choose high-quality soy foods, such as tofu, tempeh, soy milk, and edamame (whole soybeans).  Always remove skin from  poultry.  Prepare foods by baking, roasting, broiling, boiling, poaching, steaming, grilling, or stir-frying in vegetable oil.  Most stick margarines contain trans fats, and trans fats are also found in some packaged baked goods, potato chips, snack foods, fried foods, and fast food that use or create hydrogenated oils.    (All food labels must now list the amount of trans fats, right after the amount of saturated fats -- good news for consumers. As a result, many food companies have now reformulated their products to be trans fat freemany, but not all! So its still just as important to read labels and make sure the packaged foods you buy dont contain trans fats.)     If you use margarine, purchase soft-tub margarine spreads that contain 0 grams trans fats and dont list any partially hydrogenated oils in the ingredients list. By substituting olive oil or vegetable oil for trans fats in just 2 percent of your daily calories, you can reduce your risk of heart disease by 53 percent.  There is no safe amount of trans fats, so try to keep them as far from your plate as possible.  -->  Avoid foods that are concentrated in sugar (even dried fruit and fruit juice). Sugary foods can elevate triglyceride levels in the blood, so keep them to a bare minimum.  --> Swap out refined carbohydrates for whole grains.  Refined carbohydrates -- like white rice, regular pasta, and anything made with white or enriched flour (including white bread, rolls, cereals, buns, and crackers) -- raise blood sugar and insulin levels more than fiber-rich whole grains. Higher insulin levels, in turn, can lead to a higher rise in triglycerides after a meal. So, make the switch to whole wheat bread, whole grain pasta, brown or wild rice, and whole grain versions of cereals, crackers, and other bread products. However, its important to know that individuals with high triglycerides should moderate even their intake of high-quality  starches (since all starches raise blood sugar) -- I recommend 1 to 2 servings per meal.  --> Cut way back on alcohol.  If you have high triglycerides, alcohol should be considered a rare treat -- if you indulge at all, since even small amounts of alcohol can dramatically increase triglyceride levels.  --> Incorporate omega-3 fats.  Heart-healthy fish oils are especially rich in omega-3 fatty acids. In multiple studies over the past two decades, people who ate diets high in omega-3s had 30 to 40 percent reductions in heart disease. Although we dont yet know why fish oil works so well, there are several possibilities. Omega-3s seem to reduce inflammation, reduce high blood pressure, decrease triglycerides, raise HDL cholesterol, and make blood thinner and less sticky so it is less likely to clot. Its as close to a food prescription for heart health as it gets. If you have high triglycerides, I recommend eating at least three servings of one of the omega-3-rich fish every week (fatty fish is the most concentrated food form of omega three fats). If you cannot manage to eat that much fish, speak with your physician about taking fish oil capsules, which offer similar benefits.The best foods for omega-3 fatty acids include wild salmon (fresh, canned), herring, mackerel (not king), sardines, anchovies, rainbow trout, and Pacific oysters. Non-fish sources of omega-3 fats include omega-3-fortified eggs, ground flaxseed, chia seeds, walnuts, butternuts (white walnuts), seaweed, walnut oil, canola oil, and soybeans.  --> Quit smoking.  Smoking causes inflammation, not just in your lungs, but throughout your body. Inflammation can contribute to atherosclerosis, blood clots, and risk of heart attack. Smoking makes all heart health indicators worse. If you have high cholesterol, high triglycerides, or high blood pressure, smoking magnifies the danger.  --> Become more physically active.  Even moderate exercise can  help improve cholesterol, triglycerides, and blood pressure. Aerobic exercise seems to be able to stop the sharp rise of triglycerides after eating, perhaps because of a decrease in the amount of triglyceride released by the liver, or because active muscle clears triglycerides out of the blood stream more quickly than inactive muscle. If you havent exercised regularly (or at all) for years, I recommend starting slowly, by walking at an easy pace for 15 minutes a day. Then, as you feel more comfortable, increase the amount. Your ultimate goal should be at least 30 minutes of moderate physical activity, at least five days a week.     Generalized Anxiety Disorder, Adult  Generalized anxiety disorder (GAD) is a mental health disorder. People with this condition constantly worry about everyday  events. Unlike normal anxiety, worry related to GAD is not triggered by a specific event. These worries also do not fade or get better with time. GAD interferes with life functions, including relationships, work, and school. GAD can vary from mild to severe. People with severe GAD can have intense waves of anxiety with physical symptoms (panic attacks). What are the causes? The exact cause of GAD is not known. What increases the risk? This condition is more likely to develop in:  Women.  People who have a family history of anxiety disorders.  People who are very shy.  People who experience very stressful life events, such as the death of a loved one.  People who have a very stressful family environment.  What are the signs or symptoms? People with GAD often worry excessively about many things in their lives, such as their health and family. They may also be overly concerned about:  Doing well at work.  Being on time.  Natural disasters.  Friendships.  Physical symptoms of GAD include:  Fatigue.  Muscle tension or having muscle twitches.  Trembling or feeling shaky.  Being easily  startled.  Feeling like your heart is pounding or racing.  Feeling out of breath or like you cannot take a deep breath.  Having trouble falling asleep or staying asleep.  Sweating.  Nausea, diarrhea, or irritable bowel syndrome (IBS).  Headaches.  Trouble concentrating or remembering facts.  Restlessness.  Irritability.  How is this diagnosed? Your health care provider can diagnose GAD based on your symptoms and medical history. You will also have a physical exam. The health care provider will ask specific questions about your symptoms, including how severe they are, when they started, and if they come and go. Your health care provider may ask you about your use of alcohol or drugs, including prescription medicines. Your health care provider may refer you to a mental health specialist for further evaluation. Your health care provider will do a thorough examination and may perform additional tests to rule out other possible causes of your symptoms. To be diagnosed with GAD, a person must have anxiety that:  Is out of his or her control.  Affects several different aspects of his or her life, such as work and relationships.  Causes distress that makes him or her unable to take part in normal activities.  Includes at least three physical symptoms of GAD, such as restlessness, fatigue, trouble concentrating, irritability, muscle tension, or sleep problems.  Before your health care provider can confirm a diagnosis of GAD, these symptoms must be present more days than they are not, and they must last for six months or longer. How is this treated? The following therapies are usually used to treat GAD:  Medicine. Antidepressant medicine is usually prescribed for long-term daily control. Antianxiety medicines may be added in severe cases, especially when panic attacks occur.  Talk therapy (psychotherapy). Certain types of talk therapy can be helpful in treating GAD by providing support,  education, and guidance. Options include: ? Cognitive behavioral therapy (CBT). People learn coping skills and techniques to ease their anxiety. They learn to identify unrealistic or negative thoughts and behaviors and to replace them with positive ones. ? Acceptance and commitment therapy (ACT). This treatment teaches people how to be mindful as a way to cope with unwanted thoughts and feelings. ? Biofeedback. This process trains you to manage your body's response (physiological response) through breathing techniques and relaxation methods. You will work with a Transport planner while machines  are used to monitor your physical symptoms.  Stress management techniques. These include yoga, meditation, and exercise.  A mental health specialist can help determine which treatment is best for you. Some people see improvement with one type of therapy. However, other people require a combination of therapies. Follow these instructions at home:  Take over-the-counter and prescription medicines only as told by your health care provider.  Try to maintain a normal routine.  Try to anticipate stressful situations and allow extra time to manage them.  Practice any stress management or self-calming techniques as taught by your health care provider.  Do not punish yourself for setbacks or for not making progress.  Try to recognize your accomplishments, even if they are small.  Keep all follow-up visits as told by your health care provider. This is important. Contact a health care provider if:  Your symptoms do not get better.  Your symptoms get worse.  You have signs of depression, such as: ? A persistently sad, cranky, or irritable mood. ? Loss of enjoyment in activities that used to bring you joy. ? Change in weight or eating. ? Changes in sleeping habits. ? Avoiding friends or family members. ? Loss of energy for normal tasks. ? Feelings of guilt or worthlessness. Get help right away if:  You have  serious thoughts about hurting yourself or others. If you ever feel like you may hurt yourself or others, or have thoughts about taking your own life, get help right away. You can go to your nearest emergency department or call:  Your local emergency services (911 in the [place]  A suicide crisis helpline, such as the [facility] at 843-163-4818. This is open 24 hours a day.  Summary  Generalized anxiety disorder (GAD) is a mental health disorder that involves worry that is not triggered by a specific event.  People with GAD often worry excessively about many things in their lives, such as their health and family.  GAD may cause physical symptoms such as restlessness, trouble concentrating, sleep problems, frequent sweating, nausea, diarrhea, headaches, and trembling or muscle twitching.  A mental health specialist can help determine which treatment is best for you. Some people see improvement with one type of therapy. However, other people require a combination of therapies. This information is not intended to replace advice given to you by your health care provider. Make sure you discuss any questions you have with your health care provider. Document Released: 01/23/2013 Document Revised: 08/18/2016 Document Reviewed: 08/18/2016 Elsevier Interactive Patient Education  2018 [place]  After being diagnosed with an anxiety disorder, you may be relieved to know why you have felt or behaved a certain way. It is natural to also feel overwhelmed about the treatment ahead and what it will mean for your life. With care and support, you can manage this condition and recover from it. How to cope with anxiety Dealing with stress Stress is your bodys reaction to life changes and events, both good and bad. Stress can last just a few hours or it can be ongoing. Stress can play a major role in anxiety, so it is important to learn both how to  cope with stress and how to think about it differently. Talk with your health care provider or a counselor to learn more about stress reduction. He or she may suggest some stress reduction techniques, such as:  Music therapy. This can include creating or listening to music that  you enjoy and that inspires you.  Mindfulness-based meditation. This involves being aware of your normal breaths, rather than trying to control your breathing. It can be done while sitting or walking.  Centering prayer. This is a kind of meditation that involves focusing on a word, phrase, or sacred image that is meaningful to you and that brings you peace.  Deep breathing. To do this, expand your stomach and inhale slowly through your nose. Hold your breath for 3-5 seconds. Then exhale slowly, allowing your stomach muscles to relax.  Self-talk. This is a skill where you identify thought patterns that lead to anxiety reactions and correct those thoughts.  Muscle relaxation. This involves tensing muscles then relaxing them.  Choose a stress reduction technique that fits your lifestyle and personality. Stress reduction techniques take time and practice. Set aside 5-15 minutes a day to do them. Therapists can offer training in these techniques. The training may be covered by some insurance plans. Other things you can do to manage stress include:  Keeping a stress diary. This can help you learn what triggers your stress and ways to control your response.  Thinking about how you respond to certain situations. You may not be able to control everything, but you can control your reaction.  Making time for activities that help you relax, and not feeling guilty about spending your time in this way.  Therapy combined with coping and stress-reduction skills provides the best chance for successful treatment. Medicines Medicines can help ease symptoms. Medicines for anxiety include:  Anti-anxiety  drugs.  Antidepressants.  Beta-blockers.  Medicines may be used as the main treatment for anxiety disorder, along with therapy, or if other treatments are not working. Medicines should be prescribed by a health care provider. Relationships Relationships can play a big part in helping you recover. Try to spend more time connecting with trusted friends and family members. Consider going to couples counseling, taking family education classes, or going to family therapy. Therapy can help you and others better understand the condition. How to recognize changes in your condition Everyone has a different response to treatment for anxiety. Recovery from anxiety happens when symptoms decrease and stop interfering with your daily activities at home or work. This may mean that you will start to:  Have better concentration and focus.  Sleep better.  Be less irritable.  Have more energy.  Have improved memory.  It is important to recognize when your condition is getting worse. Contact your health care provider if your symptoms interfere with home or work and you do not feel like your condition is improving. Where to find help and support: You can get help and support from these sources:  Self-help groups.  Online and [employer].  A trusted spiritual leader.  Couples counseling.  Family education classes.  Family therapy.  Follow these instructions at home:  Eat a healthy diet that includes plenty of vegetables, fruits, whole grains, low-fat dairy products, and lean protein. Do not eat a lot of foods that are high in solid fats, added sugars, or salt.  Exercise. Most adults should do the following: ? Exercise for at least 150 minutes each week. The exercise should increase your heart rate and make you sweat (moderate-intensity exercise). ? Strengthening exercises at least twice a week.  Cut down on caffeine, tobacco, alcohol, and other potentially harmful  substances.  Get the right amount and quality of sleep. Most adults need 7-9 hours of sleep each night.  Make choices that simplify  your life.  Take over-the-counter and prescription medicines only as told by your health care provider.  Avoid caffeine, alcohol, and certain over-the-counter cold medicines. These may make you feel worse. Ask your pharmacist which medicines to avoid.  Keep all follow-up visits as told by your health care provider. This is important. Questions to ask your health care provider  Would I benefit from therapy?  How often should I follow up with a health care provider?  How long do I need to take medicine?  Are there any long-term side effects of my medicine?  Are there any alternatives to taking medicine? Contact a health care provider if:  You have a hard time staying focused or finishing daily tasks.  You spend many hours a day feeling worried about everyday life.  You become exhausted by worry.  You start to have headaches, feel tense, or have nausea.  You urinate more than normal.  You have diarrhea. Get help right away if:  You have a racing heart and shortness of breath.  You have thoughts of hurting yourself or others. If you ever feel like you may hurt yourself or others, or have thoughts about taking your own life, get help right away. You can go to your nearest emergency department or call:  Your local emergency services (911 in the [place]  A suicide crisis helpline, such as the [facility] at 9341104165. This is open 24-hours a day.  Summary  Taking steps to deal with stress can help calm you.  Medicines cannot cure anxiety disorders, but they can help ease symptoms.  Family, friends, and partners can play a big part in helping you recover from an anxiety disorder. This information is not intended to replace advice given to you by your health care provider. Make sure you discuss any questions you  have with your health care provider. Document Released: 09/22/2016 Document Revised: 09/22/2016 Document Reviewed: 09/22/2016 Elsevier Interactive Patient Education  [employer].

## 2018-07-20 MED FILL — ESCITALOPRAM 10 MG TABLET: 10 | 30 days supply | Qty: 30 | Fill #3

## 2018-08-01 MED FILL — ATORVASTATIN 10 MG TABLET: 10 | 30 days supply | Qty: 30 | Fill #1

## 2018-09-12 MED FILL — ATORVASTATIN 10 MG TABLET: 10 | 30 days supply | Qty: 30 | Fill #2

## 2018-09-26 ENCOUNTER — Other Ambulatory Visit: Payer: Self-pay | Admitting: Family Medicine

## 2018-09-26 ENCOUNTER — Telehealth: Payer: Self-pay | Admitting: Family Medicine

## 2018-09-26 DIAGNOSIS — R635 Abnormal weight gain: Secondary | ICD-10-CM

## 2018-09-26 DIAGNOSIS — T50905A Adverse effect of unspecified drugs, medicaments and biological substances, initial encounter: Secondary | ICD-10-CM

## 2018-09-26 DIAGNOSIS — R6882 Decreased libido: Secondary | ICD-10-CM

## 2018-09-26 DIAGNOSIS — F39 Unspecified mood [affective] disorder: Secondary | ICD-10-CM

## 2018-09-26 NOTE — Telephone Encounter (Signed)
-----   Message from Jerilee Field, [place] sent at 09/26/2018 11:46 AM EST ----- Patient is due for follow up, please call the patient to make an appointment. 30 day supply of meds sent in for patient  Thanks. MPulliam, CMA/RT(R)

## 2018-09-26 NOTE — Telephone Encounter (Signed)
Called listed ph# to set patient up for provider required OV-- no answer/ unable to leave msg pt's voicemail box full.  --Forwarding note to medical asst-- will try again later.  --glh

## 2018-09-26 NOTE — Telephone Encounter (Signed)
Noted MPulliam, CMA/RT(R)  

## 2018-09-28 ENCOUNTER — Telehealth: Payer: Self-pay | Admitting: Family Medicine

## 2018-09-28 NOTE — Telephone Encounter (Signed)
Patient is requesting a call back from nurse regarding Wellbutrin prescription. Patient was advised by pharm that she did not have a valid prescription on file nor any refills. I told patient that we sent in a refill on 09/26/18 for this but would need an OV for any further refills. Patient has scheduled this med f/u for Jan 2020 but is wanting to figure out the disconnect with the pharmacy on this refill request currently ordered. Please advise

## 2018-09-29 NOTE — Telephone Encounter (Signed)
Called the patient and left message for the patient to call the office.  Spoke to the pharmacy they have reviewed the refill but it is too early to fill. MPulliam, CMA/RT(R)

## 2018-09-30 MED FILL — buPROPion HCL 75 MG TABS: 75 | 30 days supply | Qty: 60 | Fill #0

## 2018-10-19 ENCOUNTER — Ambulatory Visit (INDEPENDENT_AMBULATORY_CARE_PROVIDER_SITE_OTHER): Payer: No Typology Code available for payment source | Admitting: Family Medicine

## 2018-10-19 ENCOUNTER — Encounter: Payer: Self-pay | Admitting: Family Medicine

## 2018-10-19 VITALS — BP 115/71 | HR 82 | Temp 99.0°F | Ht 65.0 in | Wt 197.9 lb

## 2018-10-19 DIAGNOSIS — E669 Obesity, unspecified: Secondary | ICD-10-CM

## 2018-10-19 DIAGNOSIS — F4320 Adjustment disorder, unspecified: Secondary | ICD-10-CM

## 2018-10-19 DIAGNOSIS — R6882 Decreased libido: Secondary | ICD-10-CM

## 2018-10-19 MED ORDER — ATORVASTATIN CALCIUM 10 MG PO TABS: 10.0000 mg | ORAL_TABLET | Freq: Every day | ORAL | 1 refills | Status: DC

## 2018-10-19 NOTE — Patient Instructions (Addendum)
Please look into the LoseIt or MyFitnessPal app for assistance with tracking your nutritional intake.  Follow-up in 4 weeks and bring in your phone backslash app so we can go over it and discussed challenges and new health goals next OV.    -Below might be some helpful tips for you.      Behavior Modification Ideas for Weight Management  Weight management involves adopting a healthy lifestyle that includes a knowledge of nutrition and exercise, a positive attitude and the right kind of motivation. Internal motives such as better health, increased energy, self-esteem and personal control increase your chances of lifelong weight management success.  Remember to have realistic goals and think long-term success. Believe in yourself and you can do it. The following information will give you ideas to help you meet your goals.  Control Your Home Environment  Eat only while sitting down at the kitchen or dining room table. Do not eat while watching television, reading, cooking, talking on the phone, standing at the refrigerator or working on the computer. Keep tempting foods out of the house -- don't buy them. Keep tempting foods out of sight. Have low-calorie foods ready to eat. Unless you are preparing a meal, stay out of the kitchen. Have healthy snacks at your disposal, such as small pieces of fruit, vegetables, canned fruit, pretzels, low-fat string cheese and nonfat cottage cheese.  Control Your Work Environment  Do not eat at [employer] or keep tempting snacks at your desk. If you get hungry between meals, plan healthy snacks and bring them with you to work. During your breaks, go for a walk instead of eating. If you work around food, plan in advance the one item you will eat at mealtime. Make it inconvenient to nibble on food by chewing gum, sugarless candy or drinking water or another low-calorie beverage. Do not work through meals. Skipping meals slows down metabolism and may result  in overeating at the next meal. If food is available for special occasions, either pick the healthiest item, nibble on low-fat snacks brought from home, don't have anything offered, choose one option and have a small amount, or have only a beverage.  Control Your Mealtime Environment  Serve your plate of food at the stove or kitchen counter. Do not put the serving dishes on the table. If you do put dishes on the table, remove them immediately when finished eating. Fill half of your plate with vegetables, a quarter with lean protein and a quarter with starch. Use smaller plates, bowls and glasses. A smaller portion will look large when it is in a little dish. Politely refuse second helpings. When fixing your plate, limit portions of food to one scoop/serving or less.   Daily Food Management  Replace eating with another activity that you will not associate with food. Wait 20 minutes before eating something you are craving. Drink a large glass of water or diet soda before eating. Always have a big glass or bottle of water to drink throughout the day. Avoid high-calorie add-ons such as cream with your coffee, butter, mayonnaise and salad dressings.  Shopping: Do not shop when hungry or tired. Shop from a list and avoid buying anything that is not on your list. If you must have tempting foods, buy individual-sized packages and try to find a lower-calorie alternative. Don't taste test in the store. Read food labels. Compare products to help you make the healthiest choices.  Preparation: Chew a piece of gum while cooking meals. Use a  quarter teaspoon if you taste test your food. Try to only fix what you are going to eat, leaving yourself no chance for seconds. If you have prepared more food than you need, portion it into individual containers and freeze or refrigerate immediately. Don't snack while cooking meals.  Eating: Eat slowly. Remember it takes about 20 minutes for your stomach to  send a message to your brain that it is full. Don't let fake hunger make you think you need more. The ideal way to eat is to take a bite, put your utensil down, take a sip of water, cut your next bite, take a bit, put your utensil down and so on. Do not cut your food all at one time. Cut only as needed. Take small bites and chew your food well. Stop eating for a minute or two at least once during a meal or snack. Take breaks to reflect and have conversation.  Cleanup and Leftovers: Label leftovers for a specific meal or snack. Freeze or refrigerate individual portions of leftovers. Do not clean up if you are still hungry.  Eating Out and Social Eating  Do not arrive hungry. Eat something light before the meal. Try to fill up on low-calorie foods, such as vegetables and fruit, and eat smaller portions of the high-calorie foods. Eat foods that you like, but choose small portions. If you want seconds, wait at least 20 minutes after you have eaten to see if you are actually hungry or if your eyes are bigger than your stomach. Limit alcoholic beverages. Try a soda water with a twist of lime. Do not skip other meals in the day to save room for the special event.  At Restaurants: Order  la carte rather than buffet style. Order some vegetables or a salad for an appetizer instead of eating bread. If you order a high-calorie dish, share it with someone. Try an after-dinner mint with your coffee. If you do have dessert, share it with two or more people. Don't overeat because you do not want to waste food. Ask for a doggie bag to take extra food home. Tell the server to put half of your entree in a to go bag before the meal is served to you. Ask for salad dressing, gravy or high-fat sauces on the side. Dip the tip of your fork in the dressing before each bite. If bread is served, ask for only one piece. Try it plain without butter or oil. At [employer] where oil and vinegar is served with  bread, use only a small amount of oil and a lot of vinegar for dipping.  At a [facility]: Offer to bring a dish, appetizer or dessert that is low in calories. Serve yourself small portions or tell the host that you only want a small amount. Stand or sit away from the snack table. Stay away from the kitchen or stay busy if you are near the food. Limit your alcohol intake.  At [employer] and Cafeterias: Cover most of your plate with lettuce and/or vegetables. Use a salad plate instead of a dinner plate. After eating, clear away your dishes before having coffee or tea.  Entertaining at Home: Explore low-fat, low-cholesterol cookbooks. Use single-serving foods like chicken breasts or hamburger patties. Prepare low-calorie appetizers and desserts.   Holidays: Keep tempting foods out of sight. Decorate the house without using food. Have low-calorie beverages and foods on hand for guests. Allow yourself one planned treat a day. Don't skip meals to save up  for the holiday feast. Eat regular, planned meals.   Exercise Well  Make exercise a priority and a planned activity in the day. If possible, walk the entire or part of the distance to work. Get an exercise buddy. Go for a walk with a colleague during one of your breaks, go to the gym, run or take a walk with a friend, walk in the mall with a shopping companion. Park at the end of the parking lot and walk to the store or office entrance. Always take the stairs all of the way or at least part of the way to your floor. If you have a desk job, walk around the office frequently. Do leg lifts while sitting at your desk. Do something outside on the weekends like going for a hike or a bike ride.   Have a Healthy Attitude  Make health your weight management priority. Be realistic. Have a goal to achieve a healthier you, not necessarily the lowest weight or ideal weight based on calculations or tables. Focus on a healthy eating style, not  on dieting. Dieting usually lasts for a short amount of time and rarely produces long-term success. Think long term. You are developing new healthy behaviors to follow next month, in a year and in a decade.    This information is for educational purposes only and is not intended to replace the advice of your doctor or health care provider. We encourage you to discuss with your doctor any questions or concerns you may have.        Guidelines for Losing Weight   We want weight loss that will last so you should lose 1-2 pounds a week.  THAT IS IT! Please pick THREE things a month to change. Once it is a habit check off the item. Then pick another three items off the list to become habits.  If you are already doing a habit on the list GREAT!  Cross that item off!  Dont drink your calories. Ie, alcohol, soda, fruit juice, and sweet tea.   Drink more water. Drink a glass when you feel hungry or before each meal.   Eat breakfast - Complex carb and protein (likeDannon light and fit yogurt, oatmeal, fruit, eggs, [place] bacon).  Measure your cereal.  Eat no more than one cup a day. (ie Kashi)  Eat an apple a day.  Add a vegetable a day.  Try a new vegetable a month.  Use Pam! Stop using oil or butter to cook.  Dont finish your plate or use smaller plates.  Share your dessert.  Eat sugar free Jello for dessert or frozen grapes.  Dont eat 2-3 hours before bed.  Switch to whole wheat bread, pasta, and brown rice.  Make healthier choices when you eat out. No fries!  Pick baked chicken, NOT fried.  Dont forget to SLOW DOWN when you eat. It is not going anywhere.   Take the stairs.  Park far away in the parking lot  Lift soup cans (or weights) for 10 minutes while watching TV.  Walk at work for 10 minutes during break.  Walk outside 1 time a week with your friend, kids, dog, or significant other.  Start a walking group at church.  Walk the mall as much as you can  tolerate.   Keep a food diary.  Weigh yourself daily.  Walk for 15 minutes 3 days per week.  Cook at home more often and eat out less. If life happens and you  go back to old habits, it is okay.  Just start over. You can do it!  If you experience chest pain, get short of breath, or tired during the exercise, please stop immediately and inform your doctor.    Before you even begin to attack a weight-loss plan, it pays to remember this: You are not fat. You have fat. Losing weight isn't about blame or shame; it's simply another achievement to accomplish. Dieting is like any other skill--you have to buckle down and work at it. As long as you act in a smart, reasonable way, you'll ultimately get where you want to be. Here are some weight loss pearls for you.   1. It's Not a Diet. It's a Lifestyle Thinking of a diet as something you're on and suffering through only for the short term doesn't work. To shed weight and keep it off, you need to make permanent changes to the way you eat. It's OK to indulge occasionally, of course, but if you cut calories temporarily and then revert to your old way of eating, you'll gain back the weight quicker than you can say yo-yo. Use it to lose it. Research shows that one of the best predictors of long-term weight loss is how many pounds you drop in the first month. For that reason, nutritionists often suggest being stricter for the first two weeks of your new eating strategy to build momentum. Cut out added sugar and alcohol and avoid unrefined carbs. After that, figure out how you can reincorporate them in a way that's healthy and maintainable.  2. There's a Right Way to Exercise Working out burns calories and fat and boosts your metabolism by building muscle. But those trying to lose weight are notorious for overestimating the number of calories they burn and underestimating the amount they take in. Unfortunately, your system is biologically programmed to hold on to  extra pounds and that means when you start exercising, your body senses the deficit and ramps up its hunger signals. If you're not diligent, you'll eat everything you burn and then some. Use it, to lose it. Cardio gets all the exercise glory, but strength and interval training are the real heroes. They help you build lean muscle, which in turn increases your metabolism and calorie-burning ability 3. Don't Overreact to Mild Hunger Some people have a hard time losing weight because of hunger anxiety. To them, being hungry is bad--something to be avoided at all costs--so they carry snacks with them and eat when they don't need to. Others eat because they're stressed out or bored. While you never want to get to the point of being ravenous (that's when bingeing is likely to happen), a hunger pang, a craving, or the fact that it's 3:00 p.m. should not send you racing for the vending machine or obsessing about the energy bar in your purse. Ideally, you should put off eating until your stomach is growling and it's difficult to concentrate.  Use it to lose it. When you feel the urge to eat, use the HALT method. Ask yourself, Am I really hungry? Or am I angry or anxious, lonely or bored, or tired? If you're still not certain, try the apple test. If you're truly hungry, an apple should seem delicious; if it doesn't, something else is going on. Or you can try drinking water and making yourself busy, if you are still hungry try a healthy snack.  4. Not All Calories Are Created Equal The mechanics of weight loss are pretty simple:  Take in fewer calories than you use for energy. But the kind of food you eat makes all the difference. Processed food that's high in saturated fat and refined starch or sugar can cause inflammation that disrupts the hormone signals that tell your brain you're full. The result: You eat a lot more.  Use it to lose it. Clean up your diet. Swap in whole, unprocessed foods, including vegetables, lean  protein, and healthy fats that will fill you up and give you the biggest nutritional bang for your calorie buck. In a few weeks, as your brain starts receiving regular hunger and fullness signals once again, you'll notice that you feel less hungry overall and naturally start cutting back on the amount you eat.  5. Protein, Produce, and Plant-Based Fats Are Your Weight-Loss Trinity Here's why eating the three Ps regularly will help you drop pounds. Protein fills you up. You need it to build lean muscle, which keeps your metabolism humming so that you can torch more fat. People in a weight-loss program who ate double the recommended daily allowance for protein (about 110 grams for a 150-pound woman) lost 70 percent of their weight from fat, while people who ate the RDA lost only about 40 percent, one study found. Produce is packed with filling fiber. "It's very difficult to consume too many calories if you're eating a lot of vegetables. Example: Three cups of broccoli is a lot of food, yet only 93 calories. (Fruit is another story. It can be easy to overeat and can contain a lot of calories from sugar, so be sure to monitor your intake.) Plant-based fats like olive oil and those in avocados and nuts are healthy and extra satiating.  Use it to lose it. Aim to incorporate each of the three Ps into every meal and snack. People who eat protein throughout the day are able to keep weight off, according to a study in the [place] of Clinical Nutrition. In addition to meat, poultry and seafood, good sources are beans, lentils, eggs, tofu, and yogurt. As for fat, keep portion sizes in check by measuring out salad dressing, oil, and nut butters (shoot for one to two tablespoons). Finally, eat veggies or a little fruit at every meal. People who did that consumed 308 fewer calories but didn't feel any hungrier than when they didn't eat more produce.  7. How You Eat Is As Important As What You Eat In order for  your brain to register that you're full, you need to focus on what you're eating. Sit down whenever you eat, preferably at a table. Turn off the TV or computer, put down your phone, and look at your food. Smell it. Chew slowly, and don't put another bite on your fork until you swallow. When women ate lunch this attentively, they consumed 30 percent less when snacking later than those who listened to an audiobook at lunchtime, according to a study in the [place] of Nutrition. 8. Weighing Yourself Really Works The scale provides the best evidence about whether your efforts are paying off. Seeing the numbers tick up or down or stagnate is motivation to keep going--or to rethink your approach. A 2015 study at [facility] found that daily weigh-ins helped people lose more weight, keep it off, and maintain that loss, even after two years. Use it to lose it. Step on the scale at the same time every day for the best results. If your weight shoots up several pounds from one weigh-in to the  next, don't freak out. Eating a lot of salt the night before or having your period is the likely culprit. The number should return to normal in a day or two. It's a steady climb that you need to do something about. 9. Too Much Stress and Too Little Sleep Are Your Enemies When you're tired and frazzled, your body cranks up the production of cortisol, the stress hormone that can cause carb cravings. Not getting enough sleep also boosts your levels of ghrelin, a hormone associated with hunger, while suppressing leptin, a hormone that signals fullness and satiety. People on a diet who slept only five and a half hours a night for two weeks lost 55 percent less fat and were hungrier than those who slept eight and a half hours, according to a study in the [place]. Use it to lose it. Prioritize sleep, aiming for seven hours or more a night, which research shows helps lower stress. And make sure  you're getting quality zzz's. If a snoring spouse or a fidgety cat wakes you up frequently throughout the night, you may end up getting the equivalent of just four hours of sleep, according to a study from [facility]. Keep pets out of the bedroom, and use a white-noise app to drown out snoring. 10. You Will Hit a plateau--And You Can Bust Through It As you slim down, your body releases much less leptin, the fullness hormone.  If you're not strength training, start right now. Building muscle can raise your metabolism to help you overcome a plateau. To keep your body challenged and burning calories, incorporate new moves and more intense intervals into your workouts or add another sweat session to your weekly routine. Alternatively, cut an extra 100 calories or so a day from your diet. Now that you've lost weight, your body simply doesn't need as much fuel.    Since food equals calories, in order to lose weight you must either eat fewer calories, exercise more to burn off calories with activity, or both. Food that is not used to fuel the body is stored as fat. A major component of losing weight is to make smarter food choices. Here's how:  1)   Limit non-nutritious foods, such as: Sugar, honey, syrups and candy Pastries, donuts, pies, cakes and cookies Soft drinks, sweetened juices and alcoholic beverages  2)  Cut down on high-fat foods by: - Choosing poultry, fish or lean red meat - Choosing low-fat cooking methods, such as baking, broiling, steaming, grilling and boiling - Using low-fat or non-fat dairy products - Using vinaigrette, herbs, lemon or fat-free salad dressings - Avoiding fatty meats, such as bacon, sausage, franks, ribs and luncheon meats - Avoiding high-fat snacks like nuts, chips and chocolate - Avoiding fried foods - Using less butter, margarine, oil and mayonnaise - Avoiding high-fat gravies, cream sauces and cream-based soups  3) Eat a variety of foods,  including: - Fruit and vegetables that are raw, steamed or baked - Whole grains, breads, cereal, rice and pasta - Dairy products, such as low-fat or non-fat milk or yogurt, low-fat cottage cheese and low-fat cheese - Protein-rich foods like chicken, [place], fish, lean meat and legumes, or beans  4) Change your eating habits by: - Eat three balanced meals a day to help control your hunger - Watch portion sizes and eat small servings of a variety of foods - Choose low-calorie snacks - Eat only when you are hungry and stop when you are satisfied - Eat  slowly and try not to perform other tasks while eating - Find other activities to distract you from food, such as walking, taking up a hobby or being involved in the community - Include regular exercise in your daily routine ( minimum of 20 min of moderate-intensity exercise at least 5 days/week)  - Find a support group, if necessary, for emotional support in your weight loss journey           Easy ways to cut 100 calories   1. Eat your eggs with hot sauce OR salsa instead of cheese.  Eggs are great for breakfast, but many people consider eggs and cheese to be BFFs. Instead of cheese--1 oz. of cheddar has 114 calories--top your eggs with hot sauce, which contains no calories and helps with satiety and metabolism. Salsa is also a great option!!  2. Top your toast, waffles or pancakes with fresh berries instead of jelly or syrup. Half a cup of berries--fresh, frozen or thawed--has about 40 calories, compared with 2 tbsp. of maple syrup or jelly, which both have about 100 calories. The berries will also give you a good punch of fiber, which helps keep you full and satisfied and wont spike blood sugar quickly like the jelly or syrup. 3. Swap the non-fat latte for black coffee with a splash of half-and-half. Contrary to its name, that non-fat latte has 130 calories and a startling 19g of carbohydrates per 16 oz. serving. Replacing that light  drinkable dessert with a black coffee with a splash of half-and-half saves you more than 100 calories per 16 oz. serving. 4. Sprinkle salads with freeze-dried raspberries instead of dried cranberries. If you want a sweet addition to your nutritious salad, stay away from dried cranberries. They have a whopping 130 calories per  cup and 30g carbohydrates. Instead, sprinkle freeze-dried raspberries guilt-free and save more than 100 calories per  cup serving, adding 3g of belly-filling fiber. 5. Go for mustard in place of mayo on your sandwich. Mustard can add really nice flavor to any sandwich, and there are tons of varieties, from spicy to honey. A serving of mayo is 95 calories, versus 10 calories in a serving of mustard.  Or try an avocado mayo spread: You can find the recipe few click this link: https://www.californiaavocado.com/recipes/recipe-container/[place]-avocado-mayo 6. Choose a DIY salad dressing instead of the store-bought kind. Mix Dijon or whole grain mustard with low-fat Kefir or red wine vinegar and garlic. 7. Use hummus as a spread instead of a dip. Use hummus as a spread on a high-fiber cracker or tortilla with a sandwich and save on calories without sacrificing taste. 8. Pick just one salad accessory. Salad isnt automatically a calorie winner. Its easy to over-accessorize with toppings. Instead of topping your salad with nuts, avocado and cranberries (all three will clock in at 313 calories), just pick one. The next day, choose a different accessory, which will also keep your salad interesting. You dont wear all your jewelry every day, right? 9. Ditch the white pasta in favor of spaghetti squash. One cup of cooked spaghetti squash has about 40 calories, compared with traditional spaghetti, which comes with more than 200. Spaghetti squash is also nutrient-dense. Its a good source of fiber and Vitamins A and C, and it can be eaten just like you would eat pasta--with a great  tomato sauce and [place] meatballs or with pesto, tofu and spinach, for example. 10. Dress up your chili, soups and stews with non-fat Greek yogurt instead of sour cream.  Just a dollop of sour cream can set you back 115 calories and a whopping 12g of fat--seven of which are of the artery-clogging variety. Added bonus: [place] yogurt is packed with muscle-building protein, calcium and B Vitamins. 11. Mash cauliflower instead of mashed potatoes. One cup of traditional mashed potatoes--in all their creamy goodness--has more than 200 calories, compared to mashed cauliflower, which you can typically eat for less than 100 calories per 1 cup serving. Cauliflower is a great source of the antioxidant indole-3-carbinol (I3C), which may help reduce the risk of some cancers, like breast cancer. 12. Ditch the ice cream sundae in favor of a [place] yogurt parfait. Instead of a cup of ice cream or fro-yo for dessert, try 1 cup of nonfat Greek yogurt topped with fresh berries and a sprinkle of cacao nibs. Both toppings are packed with antioxidants, which can help reduce cellular inflammation and oxidative damage. And the comparison is a no-brainer: One cup of ice cream has about 275 calories; one cup of frozen yogurt has about 230; and a cup of Greek yogurt has just 130, plus twice the protein, so youre less likely to return to the freezer for a second helping. 13. Put olive oil in a spray container instead of using it directly from the bottle. Each tablespoon of olive oil is 120 calories and 15g of fat. Use a mister instead of pouring it straight into the pan or onto a salad. This allows for portion control and will save you more than 100 calories. 14. When baking, substitute canned pumpkin for butter or oil. Canned pumpkin--not pumpkin pie mix--is loaded with Vitamin A, which is important for skin and eye health, as well as immunity. And the comparisons are pretty crazy:  cup of canned pumpkin has about 40 calories,  compared to butter or oil, which has more than 800 calories. Yes, 800 calories. Applesauce and mashed banana can also serve as good substitutions for butter or oil, usually in a 1:1 ratio. 15. Top casseroles with high-fiber cereal instead of breadcrumbs. Breadcrumbs are typically made with white bread, while breakfast cereals contain 5-9g of fiber per serving. Not only will you save more than 150 calories per  cup serving, the swap will also keep you more full and youll get a metabolism boost from the added fiber. 16. Snack on pistachios instead of macadamia nuts. Believe it or not, you get the same amount of calories from 35 pistachios (100 calories) as you would from only five macadamia nuts. 17. Chow down on kale chips rather than potato chips. This is my favorite dont knock it till you try it swap. Kale chips are so easy to make at home, and you can spice them up with a little grated parmesan or chili powder. Plus, theyre a mere fraction of the calories of potato chips, but with the same crunch factor we crave so often. 18. Add seltzer and some fruit slices to your cocktail instead of soda or fruit juice. One cup of soda or fruit juice can pack on as much as 140 calories. Instead, use seltzer and fruit slices. The fruit provides valuable phytochemicals, such as flavonoids and anthocyanins, which help to combat cancer and stave off the aging process.

## 2018-10-19 NOTE — Progress Notes (Signed)
Impression and Recommendations:    1. Low libido-  improved with med change   2. Adjustment disorder, unspecified type Chronic  3. Elevated BMI      Low libido-  improved with med change  Adjustment disorder, unspecified type, Chronic  Elevated BMI  1. Mood - Managed on Wellbutrin - Last appointment discontinued Lexapro and began Wellbutrin.  - Mood is managed well at this time on Wellbutrin. - Continue treatment plan as recommended.  See med list below. - Patient tolerating meds well without complication.  Denies S-E  - Reviewed the "spokes of the wheel" of mood and health management.  Stressed the importance of ongoing prudent habits, including regular exercise, appropriate sleep hygiene, healthful dietary habits, and prayer/meditation to relax.  2. Concerns about Low Libido - Now Resolved - On the Wellbutrin, patient's libido has improved. - Will continue to monitor.  3. BMI Counseling - BMI of 32.93 Explained to patient what BMI refers to, and what it means medically.  Told patient to think about it as a "medical risk stratification measurement" and how increasing BMI is associated with increasing risk/ or worsening state of various diseases such as hypertension, hyperlipidemia, diabetes, premature OA, depression etc.  American Heart Association guidelines for healthy diet, basically Mediterranean diet, and exercise guidelines of 30 minutes 5 days per week or more discussed in detail.  Health counseling performed.  All questions answered.  - Encouraged patient to attend [employer] as desired.  - Extensive education provided today regarding tracking nutritional intake and getting started with weight loss goals.  Patient knows that she may return to the clinic PRN for assistance.  4. Lifestyle & Preventative Health Maintenance - Advised patient to continue working toward exercising to improve overall mental, physical, and emotional health.    - Encouraged  patient to engage in daily physical activity, especially a formal exercise routine.  Recommended that the patient eventually strive for at least 150 minutes of moderate cardiovascular activity per week according to guidelines established by the [facility].   - Healthy dietary habits encouraged, including low-carb, and high amounts of lean protein in diet.   - Patient should also consume adequate amounts of water.   Education and routine counseling performed. Handouts provided.   Medications Discontinued During This Encounter  Medication Reason  . atorvastatin (LIPITOR) 10 MG tablet Reorder     Meds ordered this encounter  Medications  . atorvastatin (LIPITOR) 10 MG tablet    Sig: Take 1 tablet (10 mg total) by mouth daily.    Dispense:  90 tablet    Refill:  1    Please see AVS handed out to patient at the end of our visit for further patient instructions/ counseling done pertaining to today's office visit.   Return in about 4 weeks (around 11/16/2018) for weight and health goals follow up 4 wks.     Note:  This document was prepared using Dragon voice recognition software and may include unintentional dictation errors.   This document serves as a record of services personally performed by Mellody Dance, DO. It was created on her behalf by Toni Amend, a trained medical scribe. The creation of this record is based on the scribe's personal observations and the provider's statements to them.   I have reviewed the above medical documentation for accuracy and completeness and I concur.  Mellody Dance, DO 10/19/2018 10:06 PM      -------------------------------------------------------------------------------------------------------------------------------------    Subjective:    CC:  Chief Complaint  Patient presents with  . Follow-up    HPI: Denise Strong is a 43 y.o. female who presents to [place] at [facility] today for follow-up of mood.    Mood Managed on Wellbutrin Is completely off of Lexapro and now managed on Wellbutrin only.  Feels the Lexapro was "stronger" for the anxiety, but there were so many other concerns with the Lexapro, she is glad to be off of it.  States that "if Lexapro was 100% helping with the anxiety, Wellbutrin is like 85%," but prefers the other benefits while on the Wellbutrin.  Feels she's sleeping alright.  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  Denies concerns about migraines.  Libido Concerns - Resolved on Wellbutrin On the Wellbutrin, her sex drive has returned.  States "I am no longer touch-me-not."  [employer] & Weight Loss Goals Notes that she and her wife are both members of [employer], but she has not been utilizing it.  Patient is interested in assistance with weight loss.    Depression screen [facility] 2/9 10/19/2018 07/19/2018  Decreased Interest 0 0  Down, Depressed, Hopeless 0 0  PHQ - 2 Score 0 0  Altered sleeping 1 0  Tired, decreased energy 0 0  Change in appetite 0 0  Feeling bad or failure about yourself  0 0  Trouble concentrating 0 0  Moving slowly or fidgety/restless 0 0  Suicidal thoughts 0 0  PHQ-9 Score 1 0  Difficult doing work/chores Not difficult at all Not difficult at all     GAD 7 : Generalized Anxiety Score 07/19/2018  Nervous, Anxious, on Edge 0  Control/stop worrying 0  Worry too much - different things 0  Trouble relaxing 0  Restless 0  Easily annoyed or irritable 0  Afraid - awful might happen 0  Total GAD 7 Score 0  Anxiety Difficulty Not difficult at all     Wt Readings from Last 3 Encounters:  10/19/18 197 lb 14.4 oz (89.8 kg)  07/19/18 194 lb 1.6 oz (88 kg)  05/13/18 196 lb (88.9 kg)   BP Readings from Last 3 Encounters:  10/19/18 115/71  07/19/18 112/73  05/13/18 116/76   Pulse Readings from Last 3 Encounters:  10/19/18 82  07/19/18 65  05/13/18 74   BMI Readings  from Last 3 Encounters:  10/19/18 32.93 kg/m  07/19/18 32.30 kg/m  05/13/18 32.62 kg/m         Patient Care Team    Relationship Specialty Notifications Start End  Mellody Dance, DO PCP - General Family Medicine  05/13/18   Skeet Latch, MD Attending Physician Cardiology  05/13/18   Garlan Fillers, MD Consulting Physician Family Medicine  05/16/18      Patient Active Problem List   Diagnosis Date Noted  . Mood disorder ([place]) 05/13/2018    Priority: High  . Adjustment disorder with mixed anxiety and depressed mood 05/13/2018    Priority: High  . Hyperlipidemia 02/20/2016    Priority: Medium  . Migraine headache 03/21/2014    Priority: Low  . Elevated BMI 10/19/2018  . Hypertriglyceridemia 07/19/2018  . Low HDL (under 40) 07/19/2018  . Low libido 07/19/2018  . Weight gain due to medication 07/19/2018  . Environmental and seasonal allergies 05/13/2018  . Chest pain 02/20/2016  . Palpitations 02/13/2016  . Irregular heart beat 02/13/2016  . Encounter for health maintenance examination 01/16/2016  .  Allergic rhinitis 03/21/2014  . 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 Generalized abdominal pain 03/21/2014  . 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 Lower urinary tract infectious disease 03/07/2014    Past Medical history, Surgical history, Family history, Social history, Allergies and Medications have been entered into the medical record, reviewed and changed as needed.    Current Meds  Medication Sig  . atorvastatin (LIPITOR) 10 MG tablet Take 1 tablet (10 mg total) by mouth daily.  Marland Kitchen buPROPion (WELLBUTRIN) 75 MG tablet Take 1 tablet (75 mg total) by mouth 2 (two) times daily. Patient needs office visit for further refills  . loratadine (CLARITIN) 10 MG tablet Take 10 mg by mouth daily.  . [DISCONTINUED] atorvastatin (LIPITOR) 10 MG tablet Take 10 mg by mouth daily.    Allergies:  Allergies  Allergen Reactions  . Ceclor [Cefaclor]   .  Erythromycin   . Iodine   . Penicillins      Review of Systems: Review of Systems: General:   No F/C, wt loss Pulm:   No DIB, SOB, pleuritic chest pain Card:  No CP, palpitations Abd:  No n/v/d or pain Ext:  No inc edema from baseline Psych: no SI/ HI    Objective:   Blood pressure 115/71, pulse 82, temperature 99 F (37.2 C), height 5\' 5"  (1.651 m), weight 197 lb 14.4 oz (89.8 kg), SpO2 100 %. Body mass index is 32.93 kg/m. General:  Well Developed, well nourished, appropriate for stated age.  Neuro:  Alert and oriented,  extra-ocular muscles intact  HEENT:  Normocephalic, atraumatic, neck supple, no carotid bruits appreciated  Skin:  no gross rash, warm, pink. Cardiac:  RRR, S1 S2 Respiratory:  ECTA B/L and A/P, Not using accessory muscles, speaking in full sentences- unlabored. Vascular:  Ext warm, no cyanosis apprec.; cap RF less 2 sec. Psych:  No HI/SI, judgement and insight good, Euthymic mood. Full Affect.

## 2018-10-20 MED FILL — ATORVASTATIN 10 MG TABLET: 10 | 90 days supply | Qty: 90 | Fill #0

## 2018-10-28 ENCOUNTER — Other Ambulatory Visit: Payer: Self-pay | Admitting: Family Medicine

## 2018-10-28 DIAGNOSIS — R635 Abnormal weight gain: Secondary | ICD-10-CM

## 2018-10-28 DIAGNOSIS — R6882 Decreased libido: Secondary | ICD-10-CM

## 2018-10-28 DIAGNOSIS — F39 Unspecified mood [affective] disorder: Secondary | ICD-10-CM

## 2018-10-28 DIAGNOSIS — T50905A Adverse effect of unspecified drugs, medicaments and biological substances, initial encounter: Secondary | ICD-10-CM

## 2018-10-31 MED FILL — buPROPion HCL 75 MG TABS: 75 | 90 days supply | Qty: 180 | Fill #0

## 2018-12-28 MED FILL — ATORVASTATIN 10 MG TABLET: 10 | 30 days supply | Qty: 30 | Fill #3

## 2019-03-21 ENCOUNTER — Other Ambulatory Visit: Payer: Self-pay | Admitting: Family Medicine

## 2019-03-21 DIAGNOSIS — F39 Unspecified mood [affective] disorder: Secondary | ICD-10-CM

## 2019-03-21 DIAGNOSIS — R635 Abnormal weight gain: Secondary | ICD-10-CM

## 2019-03-21 DIAGNOSIS — R6882 Decreased libido: Secondary | ICD-10-CM

## 2019-03-21 MED FILL — buPROPion HCL 75 MG TABS: 75 | 90 days supply | Qty: 180 | Fill #0

## 2019-04-14 MED FILL — CEPHALEXIN 500 MG CAPSULE: 500 | 7 days supply | Qty: 21 | Fill #0

## 2019-04-14 MED FILL — TOBRAMYCIN 0.3 % SOLN: 0.3 | 17 days supply | Qty: 5 | Fill #0

## 2019-06-12 ENCOUNTER — Encounter: Payer: Self-pay | Admitting: Family Medicine

## 2019-06-12 ENCOUNTER — Other Ambulatory Visit: Payer: Self-pay

## 2019-06-12 DIAGNOSIS — E669 Obesity, unspecified: Secondary | ICD-10-CM

## 2019-06-14 ENCOUNTER — Ambulatory Visit (INDEPENDENT_AMBULATORY_CARE_PROVIDER_SITE_OTHER): Payer: No Typology Code available for payment source | Admitting: Family Medicine

## 2019-06-14 ENCOUNTER — Encounter: Payer: Self-pay | Admitting: Family Medicine

## 2019-06-14 ENCOUNTER — Other Ambulatory Visit: Payer: Self-pay

## 2019-06-14 ENCOUNTER — Encounter (INDEPENDENT_AMBULATORY_CARE_PROVIDER_SITE_OTHER): Payer: Self-pay | Admitting: Family Medicine

## 2019-06-14 VITALS — BP 115/66 | HR 71 | Temp 98.0°F | Ht 65.0 in | Wt 186.0 lb

## 2019-06-14 DIAGNOSIS — Z9189 Other specified personal risk factors, not elsewhere classified: Secondary | ICD-10-CM

## 2019-06-14 DIAGNOSIS — R0602 Shortness of breath: Secondary | ICD-10-CM

## 2019-06-14 DIAGNOSIS — E7849 Other hyperlipidemia: Secondary | ICD-10-CM | POA: Diagnosis not present

## 2019-06-14 DIAGNOSIS — Z0289 Encounter for other administrative examinations: Secondary | ICD-10-CM

## 2019-06-14 DIAGNOSIS — Z1331 Encounter for screening for depression: Secondary | ICD-10-CM | POA: Diagnosis not present

## 2019-06-14 DIAGNOSIS — F419 Anxiety disorder, unspecified: Secondary | ICD-10-CM | POA: Diagnosis not present

## 2019-06-14 DIAGNOSIS — Z6831 Body mass index (BMI) 31.0-31.9, adult: Secondary | ICD-10-CM

## 2019-06-14 DIAGNOSIS — E669 Obesity, unspecified: Secondary | ICD-10-CM

## 2019-06-14 DIAGNOSIS — R5383 Other fatigue: Secondary | ICD-10-CM | POA: Diagnosis not present

## 2019-06-14 NOTE — Progress Notes (Signed)
Office: [phone]  /  Fax: [phone]   HPI:   Chief Complaint: OBESITY  Denise Strong (MR# [id number]) is a 43 y.o. female who presents on 06/14/2019 for obesity evaluation and treatment. Current BMI is Body mass index is 30.95 kg/m. Denise Strong has struggled with obesity for years and has been unsuccessful in either losing weight or maintaining long term weight loss. Denise Strong attended our information session and states she is currently in the action stage of change and ready to dedicate time achieving and maintaining a healthier weight.   Denise Strong heard about our clinic from her wife. For breakfast, she is doing coffee from [employer] with extra creamer and sugar, [employer] yogurt, fruit (1/3 cup of berries), a sprinkle of granola, coconut flakes, and flax seeds (feels satisfied). For lunch, she is doing [place] sandwich with roast, cheese, veggies, and mayo, chips, and diet Dr. Malachi Strong (feels full). If she is at home, she is doing Peanut butter and jelly, with chips and fruit (feels satisfied). Mid afternoon snack, she is doing 2 cups of grapes, 1 cup of cheese, and 1 cup of crackers (eats until full). When she is working, for dinner she is doing cereal (Trix) 1 and 1/2 cup with milk (feels satisfied). When she is not working, for dinner she is doing 4.5 oz of chicken, 1 cup of vegetables, 1 cup of starch (feels full). After dinner, she is doing 1 slice of cheese or chips.   Denise Strong states her family eats meals together she thinks her family will eat healthier with  her her desired weight loss is 41-46 lbs she has been heavy most of  her life she started gaining weight in college her heaviest weight ever was 194 lbs she has significant food cravings issues  she is frequently drinking liquids with calories she frequently eats larger portions than normal  she struggles with emotional eating    Fatigue Denise Strong feels her energy is lower than it should be. This has worsened with weight gain and has not  worsened recently. Denise Strong admits to daytime somnolence and  admits to waking up still tired. Patient is at risk for obstructive sleep apnea. Patent has a history of symptoms of daytime fatigue. Patient generally gets 6 hours of sleep per night, and states they generally have generally restful sleep. Snoring is present. Apneic episodes are present. Epworth Sleepiness Score is 6.  Dyspnea on exertion Denise Strong notes increasing shortness of breath with exercising and seems to be worsening over time with weight gain. She notes getting out of breath sooner with activity than she used to. This has not gotten worse recently. EKG-normal sinus rhythm at 70 BPM. Denise Strong denies orthopnea.  Hyperlipidemia Denise Strong has hyperlipidemia and has been trying to improve her cholesterol levels with intensive lifestyle modification including a low saturated fat diet, exercise and weight loss. She is on Lipitor 10 mg. Last LDL was of 82 in 06/2018. She notes occasional myalgias with Lipitor and denies any chest pain or claudication.   At risk for cardiovascular disease Denise Strong is at a higher than average risk for cardiovascular disease due to obesity and hyperlipidemia. She currently denies any chest pain.  Anxiety Denise Strong has anxiety and notes her symptoms are somewhat controlled with Wellbutrin. She is on Wellbutrin 75 mg BID.  Depression Screen Denise Strong's Food and Mood (modified PHQ-9) score was  Depression screen PHQ 2/9 06/14/2019  Decreased Interest 1  Down, Depressed, Hopeless 1  PHQ - 2 Score 2  Altered sleeping 0  Tired, decreased energy 2  Change in appetite 0  Feeling bad or failure about yourself  1  Trouble concentrating 0  Moving slowly or fidgety/restless 0  Suicidal thoughts 0  PHQ-9 Score 5  Difficult doing work/chores Not difficult at all    ASSESSMENT AND PLAN:  Other fatigue - Plan: EKG 12-Lead, Vitamin B12, CBC With Differential, Comprehensive metabolic panel, Folate, Hemoglobin A1c, Insulin, random, T3,  T4, free, TSH, VITAMIN D 25 Hydroxy (Vit-D Deficiency, Fractures)  Shortness of breath on exertion  Other hyperlipidemia - Plan: Lipid Panel With LDL/HDL Ratio  Anxiety  Depression screening  At risk for heart disease  Class 1 obesity with serious comorbidity and body mass index (BMI) of 31.0 to 31.9 in adult, unspecified obesity type  PLAN:  Fatigue Denise Strong was informed that her fatigue may be related to obesity, depression or many other causes. Labs will be ordered, and in the meanwhile Denise Strong has agreed to work on diet, exercise and weight loss to help with fatigue. Proper sleep hygiene was discussed including the need for 7-8 hours of quality sleep each night. A sleep study was not ordered based on symptoms and Epworth score.  Dyspnea on exertion Denise Strong's shortness of breath appears to be obesity related and exercise induced. She has agreed to work on weight loss and gradually increase exercise to treat her exercise induced shortness of breath. If Denise Strong follows our instructions and loses weight without improvement of her shortness of breath, we will plan to refer to pulmonology. We will monitor this condition regularly. Denise Strong agrees to this plan.  Hyperlipidemia Denise Strong was informed of the American Heart Association Guidelines emphasizing intensive lifestyle modifications as the first line treatment for hyperlipidemia. We discussed many lifestyle modifications today in depth, and Denise Strong will continue to work on decreasing saturated fats such as fatty red meat, butter and many fried foods. She will also increase vegetables and lean protein in her diet and continue to work on exercise and weight loss efforts. We will check FLP today. Denise Strong agrees to follow up with our clinic in 2 weeks.  Cardiovascular risk counseling Denise Strong was given extended (15 minutes) coronary artery disease prevention counseling today. She is 43 y.o. female and has risk factors for heart disease including obesity and  hyperlipidemia. We discussed intensive lifestyle modifications today with an emphasis on specific weight loss instructions and strategies. Pt was also informed of the importance of increasing exercise and decreasing saturated fats to help prevent heart disease.  Anxiety We will follow up at her next appointment. Denise Strong agrees to follow up with our clinic in 2 weeks.  Depression Screen Denise Strong had a mildly positive depression screening. Depression is commonly associated with obesity and often results in emotional eating behaviors. We will monitor this closely and work on CBT to help improve the non-hunger eating patterns. Referral to Psychology may be required if no improvement is seen as she continues in our clinic.  Obesity Denise Strong is currently in the action stage of change and her goal is to continue with weight loss efforts She has agreed to follow the Category 3 plan Denise Strong has been instructed to work up to a goal of 150 minutes of combined cardio and strengthening exercise per week for weight loss and overall health benefits. We discussed the following Behavioral Modification Strategies today: increasing lean protein intake, increasing vegetables and work on meal planning and easy cooking plans, keeping healthy foods in the home, and planning for success  Denise Strong has agreed to follow up  with our clinic in 2 weeks. She was informed of the importance of frequent follow up visits to maximize her success with intensive lifestyle modifications for her multiple health conditions. She was informed we would discuss her lab results at her next visit unless there is a critical issue that needs to be addressed sooner. Denise Strong agreed to keep her next visit at the agreed upon time to discuss these results.  ALLERGIES: Allergies  Allergen Reactions  . Ceclor [Cefaclor]   . Erythromycin   . Iodine   . Penicillins     MEDICATIONS: Current Outpatient Medications on File Prior to Visit  Medication Sig Dispense  Refill  . atorvastatin (LIPITOR) 10 MG tablet Take 1 tablet (10 mg total) by mouth daily. 90 tablet 1  . buPROPion (WELLBUTRIN) 75 MG tablet TAKE 1 TABLET BY MOUTH 2 TIMES DAILY. 180 tablet 0  . loratadine (CLARITIN) 10 MG tablet Take 10 mg by mouth daily.     No current facility-administered medications on file prior to visit.     PAST MEDICAL HISTORY: Past Medical History:  Diagnosis Date  . Anxiety   . Chest pain 02/20/2016  . Constipation   . Elevated fasting blood sugar   . Hyperlipidemia 02/20/2016  . Kidney problem   . Multiple food allergies   . Palpitations 02/20/2016  . Renal disorder     PAST SURGICAL HISTORY: Past Surgical History:  Procedure Laterality Date  . APPENDECTOMY    . CHOLECYSTECTOMY      SOCIAL HISTORY: Social History   Tobacco Use  . Smoking status: Former Smoker    Quit date: 02/20/1995    Years since quitting: 24.3  . Smokeless tobacco: Never Used  . Tobacco comment: social; in college  Substance Use Topics  . Alcohol use: No    Alcohol/week: 0.0 standard drinks  . Drug use: No    FAMILY HISTORY: Family History  Problem Relation Age of Onset  . Diabetes Mother   . Hypertension Father   . Stroke Father   . Heart disease Father   . Sleep apnea Father   . Diabetes Brother   . Diabetes Maternal Grandmother   . AAA (abdominal aortic aneurysm) Maternal Grandfather   . Dementia Paternal Grandfather     ROS: Review of Systems  Constitutional: Positive for malaise/fatigue. Negative for weight loss.  HENT:       + Decreased hearing  Eyes:       + Wear glasses or contacts  Respiratory: Positive for shortness of breath (with exertion).   Cardiovascular: Negative for chest pain, orthopnea and claudication.  Gastrointestinal: Positive for constipation and diarrhea.  Musculoskeletal: Positive for myalgias.  Psychiatric/Behavioral:       + Anxiety    PHYSICAL EXAM: Blood pressure 115/66, pulse 71, temperature 98 F (36.7 C),  temperature source Oral, height 5\' 5"  (1.651 m), weight 186 lb (84.4 kg), SpO2 97 %. Body mass index is 30.95 kg/m. Physical Exam Vitals signs reviewed.  Constitutional:      Appearance: Normal appearance. She is obese.  HENT:     Head: Normocephalic and atraumatic.     Nose: Nose normal.  Eyes:     General: No scleral icterus.    Extraocular Movements: Extraocular movements intact.  Neck:     Musculoskeletal: Normal range of motion and neck supple.     Comments: No thyromegaly present Cardiovascular:     Rate and Rhythm: Normal rate and regular rhythm.     Pulses: Normal pulses.  Heart sounds: Normal heart sounds.  Pulmonary:     Effort: Pulmonary effort is normal. No respiratory distress.     Breath sounds: Normal breath sounds.  Abdominal:     Palpations: Abdomen is soft.     Tenderness: There is no abdominal tenderness.     Comments: + Obesity  Musculoskeletal: Normal range of motion.     Right lower leg: No edema.     Left lower leg: No edema.  Skin:    General: Skin is warm and dry.  Neurological:     Mental Status: She is alert and oriented to person, place, and time.     Coordination: Coordination normal.  Psychiatric:        Mood and Affect: Mood normal.        Behavior: Behavior normal.     RECENT LABS AND TESTS: BMET    Component Value Date/Time   NA 142 06/23/2018 0901   K 4.4 06/23/2018 0901   CL 104 06/23/2018 0901   CO2 24 06/23/2018 0901   GLUCOSE 94 06/23/2018 0901   GLUCOSE 85 02/20/2016 1143   BUN 12 06/23/2018 0901   CREATININE 0.66 06/23/2018 0901   CREATININE 0.56 02/20/2016 1143   CALCIUM 9.0 06/23/2018 0901   GFRNONAA 110 06/23/2018 0901   GFRAA 127 06/23/2018 0901   Lab Results  Component Value Date   HGBA1C 5.5 06/23/2018   No results found for: INSULIN CBC    Component Value Date/Time   WBC 6.3 06/23/2018 0901   RBC 4.71 06/23/2018 0901   HGB 14.2 06/23/2018 0901   HCT 41.8 06/23/2018 0901   PLT 307 06/23/2018 0901    MCV 89 06/23/2018 0901   MCH 30.1 06/23/2018 0901   MCHC 34.0 06/23/2018 0901   RDW 12.8 06/23/2018 0901   LYMPHSABS 2.1 06/23/2018 0901   EOSABS 0.1 06/23/2018 0901   BASOSABS 0.0 06/23/2018 0901   Iron/TIBC/Ferritin/ %Sat No results found for: IRON, TIBC, FERRITIN, IRONPCTSAT Lipid Panel     Component Value Date/Time   CHOL 174 06/23/2018 0901   TRIG 265 (H) 06/23/2018 0901   HDL 39 (L) 06/23/2018 0901   CHOLHDL 4.5 (H) 06/23/2018 0901   LDLCALC 82 06/23/2018 0901   Hepatic Function Panel     Component Value Date/Time   PROT 6.9 06/23/2018 0901   ALBUMIN 4.3 06/23/2018 0901   AST 18 06/23/2018 0901   ALT 21 06/23/2018 0901   ALKPHOS 69 06/23/2018 0901   BILITOT 0.4 06/23/2018 0901      Component Value Date/Time   TSH 1.410 06/23/2018 0901   Vitamin D No recent labs  ECG  shows NSR with a rate of 70 BPM INDIRECT CALORIMETER done today shows a VO2 of 275 and a REE of 1913. Her calculated basal metabolic rate is [id number] thus her basal metabolic rate is better than expected.       OBESITY BEHAVIORAL INTERVENTION VISIT  Today's visit was # 1   Starting weight: 186 lbs Starting date: 06/14/2019 Today's weight : 186 lbs Today's date: 06/14/2019 Total lbs lost to date: 0    ASK: We discussed the diagnosis of obesity with Samariyah A Rogowski today and Taniah agreed to give [place] permission to discuss obesity behavioral modification therapy today.  ASSESS: Aahna has the diagnosis of obesity and her BMI today is 30.95 Aaleyah is in the action stage of change   ADVISE: Theo was educated on the multiple health risks of obesity as well as the benefit of weight  loss to improve her health. She was advised of the need for long term treatment and the importance of lifestyle modifications to improve her current health and to decrease her risk of future health problems.  AGREE: Multiple dietary modification options and treatment options were discussed and  Deissy agreed to follow  the recommendations documented in the above note.  ARRANGE: Denise Strong was educated on the importance of frequent visits to treat obesity as outlined per CMS and USPSTF guidelines and agreed to schedule her next follow up appointment today.   I, Trixie Dredge, am acting as transcriptionist for Ilene Qua, MD  I have reviewed the above documentation for accuracy and completeness, and I agree with the above. - Ilene Qua, MD

## 2019-06-15 LAB — LIPID PANEL WITH LDL/HDL RATIO
Cholesterol, Total: 208 mg/dL — ABNORMAL HIGH (ref 100–199)
HDL: 40 mg/dL (ref >39)
LDL Chol Calc (NIH): 119 mg/dL — ABNORMAL HIGH (ref 0–99)
LDL/HDL Ratio: 3 ratio (ref 0.0–3.2)
Triglycerides: 281 mg/dL — ABNORMAL HIGH (ref 0–149)
VLDL Cholesterol Cal: 49 mg/dL — ABNORMAL HIGH (ref 5–40)

## 2019-06-15 LAB — VITAMIN B12: Vitamin B-12: 529 pg/mL (ref 232–1245)

## 2019-06-15 LAB — COMPREHENSIVE METABOLIC PANEL
ALT: 17 IU/L (ref 0–32)
AST: 14 IU/L (ref 0–40)
Albumin/Globulin Ratio: 1.8 (ref 1.2–2.2)
Albumin: 4.4 g/dL (ref 3.8–4.8)
Alkaline Phosphatase: 65 IU/L (ref 39–117)
BUN/Creatinine Ratio: 33 — ABNORMAL HIGH (ref 9–23)
BUN: 17 mg/dL (ref 6–24)
Bilirubin Total: 0.3 mg/dL (ref 0.0–1.2)
CO2: 21 mmol/L (ref 20–29)
Calcium: 8.9 mg/dL (ref 8.7–10.2)
Chloride: 106 mmol/L (ref 96–106)
Creatinine, Ser: 0.52 mg/dL — ABNORMAL LOW (ref 0.57–1.00)
GFR calc Af Amer: 136 mL/min/{1.73_m2} (ref >59)
GFR calc non Af Amer: 118 mL/min/{1.73_m2} (ref >59)
Globulin, Total: 2.5 g/dL (ref 1.5–4.5)
Glucose: 90 mg/dL (ref 65–99)
Potassium: 4.4 mmol/L (ref 3.5–5.2)
Sodium: 143 mmol/L (ref 134–144)
Total Protein: 6.9 g/dL (ref 6.0–8.5)

## 2019-06-15 LAB — CBC WITH DIFFERENTIAL
Basophils Absolute: 0.1 10*3/uL (ref 0.0–0.2)
Basos: 1 %
EOS (ABSOLUTE): 0.1 10*3/uL (ref 0.0–0.4)
Eos: 1 %
Hematocrit: 40.8 % (ref 34.0–46.6)
Hemoglobin: 13.9 g/dL (ref 11.1–15.9)
Immature Grans (Abs): 0 10*3/uL (ref 0.0–0.1)
Immature Granulocytes: 0 %
Lymphocytes Absolute: 2.1 10*3/uL (ref 0.7–3.1)
Lymphs: 35 %
MCH: 30.1 pg (ref 26.6–33.0)
MCHC: 34.1 g/dL (ref 31.5–35.7)
MCV: 88 fL (ref 79–97)
Monocytes Absolute: 0.4 10*3/uL (ref 0.1–0.9)
Monocytes: 7 %
Neutrophils Absolute: 3.5 10*3/uL (ref 1.4–7.0)
Neutrophils: 56 %
RBC: 4.62 x10E6/uL (ref 3.77–5.28)
RDW: 12.8 % (ref 11.7–15.4)
WBC: 6.2 10*3/uL (ref 3.4–10.8)

## 2019-06-15 LAB — TSH: TSH: 0.886 u[IU]/mL (ref 0.450–4.500)

## 2019-06-15 LAB — FOLATE: Folate: 18.7 ng/mL (ref >3.0)

## 2019-06-15 LAB — INSULIN, RANDOM: INSULIN: 8.9 u[IU]/mL (ref 2.6–24.9)

## 2019-06-15 LAB — T3: T3, Total: 130 ng/dL (ref 71–180)

## 2019-06-15 LAB — T4, FREE: Free T4: 1.22 ng/dL (ref 0.82–1.77)

## 2019-06-15 LAB — HEMOGLOBIN A1C
Est. average glucose Bld gHb Est-mCnc: 111 mg/dL
Hgb A1c MFr Bld: 5.5 % (ref 4.8–5.6)

## 2019-06-15 LAB — VITAMIN D 25 HYDROXY (VIT D DEFICIENCY, FRACTURES): Vit D, 25-Hydroxy: 23.3 ng/mL — ABNORMAL LOW (ref 30.0–100.0)

## 2019-06-16 ENCOUNTER — Encounter (INDEPENDENT_AMBULATORY_CARE_PROVIDER_SITE_OTHER): Payer: Self-pay | Admitting: Family Medicine

## 2019-06-20 NOTE — Telephone Encounter (Signed)
Please advise 

## 2019-06-21 NOTE — Telephone Encounter (Signed)
Please advise 

## 2019-06-28 ENCOUNTER — Other Ambulatory Visit: Payer: Self-pay

## 2019-06-28 ENCOUNTER — Ambulatory Visit (INDEPENDENT_AMBULATORY_CARE_PROVIDER_SITE_OTHER): Payer: No Typology Code available for payment source | Admitting: Family Medicine

## 2019-06-28 VITALS — BP 116/63 | HR 69 | Temp 98.2°F | Ht 65.0 in | Wt 182.0 lb

## 2019-06-28 DIAGNOSIS — E8881 Metabolic syndrome: Secondary | ICD-10-CM | POA: Diagnosis not present

## 2019-06-28 DIAGNOSIS — E782 Mixed hyperlipidemia: Secondary | ICD-10-CM

## 2019-06-28 DIAGNOSIS — Z9189 Other specified personal risk factors, not elsewhere classified: Secondary | ICD-10-CM

## 2019-06-28 DIAGNOSIS — Z6830 Body mass index (BMI) 30.0-30.9, adult: Secondary | ICD-10-CM

## 2019-06-28 DIAGNOSIS — E559 Vitamin D deficiency, unspecified: Secondary | ICD-10-CM | POA: Diagnosis not present

## 2019-06-28 DIAGNOSIS — E669 Obesity, unspecified: Secondary | ICD-10-CM

## 2019-06-28 MED ORDER — VITAMIN D (ERGOCALCIFEROL) 1.25 MG (50000 UNIT) PO CAPS: 50000.0000 [IU] | ORAL_CAPSULE | ORAL | 0 refills | Status: DC

## 2019-06-28 MED FILL — VIT D2 1.25 MG (50,000 UNIT: 1.25 MG | 28 days supply | Qty: 4 | Fill #0

## 2019-06-29 NOTE — Progress Notes (Signed)
Office: [phone]  /  Fax: [phone]   HPI:   Chief Complaint: OBESITY Denise Strong is here to discuss her progress with her obesity treatment plan. She is on the Category 3 plan and is following her eating plan approximately 90 % of the time. She states she is exercising 0 minutes 0 times per week. Ginni found she doesn't care for eggs. She was able to get all the food in. She is doing [employer] granola or [employer] crackers for snack. She denies cravings. She notes occasional hunger but sporadic in the day.  Her weight is 182 lb (82.6 kg) today and has had a weight loss of 4 pounds over a period of 2 weeks since her last visit. She has lost 4 lbs since starting treatment with [place].  Hyperlipidemia Denise Strong has hyperlipidemia and has been trying to improve her cholesterol levels with intensive lifestyle modification including a low saturated fat diet, exercise and weight loss. Last LDL was of 119 (previously 82). She is on Lipitor and denies any chest pain, claudication or myalgias.  Vitamin D Deficiency Denise Strong has a diagnosis of vitamin D deficiency. She is not currently taking OTC Vit D. She notes fatigue and denies nausea, vomiting or muscle weakness.  Insulin Resistance Denise Strong has a diagnosis of insulin resistance based on her elevated fasting insulin level >5. Last insulin was of 8.9 and Hgb A1c of 5.5. Although Denise Strong's blood glucose readings are still under good control, insulin resistance puts her at greater risk of metabolic syndrome and diabetes. She notes minimal carbohydrate cravings if any. She is not taking metformin currently and continues to work on diet and exercise to decrease risk of diabetes.  At risk for diabetes Denise Strong is at higher than average risk for developing diabetes due to her obesity and insulin resistance. She currently denies polyuria or polydipsia.  ASSESSMENT AND PLAN:  Vitamin D deficiency - Plan: Vitamin D, Ergocalciferol, (DRISDOL) 1.25 MG (50000 UT) CAPS  capsule  Insulin resistance  Mixed hyperlipidemia  At risk of diabetes mellitus  Class 1 obesity with serious comorbidity and body mass index (BMI) of 30.0 to 30.9 in adult, unspecified obesity type  PLAN:  Hyperlipidemia Denise Strong was informed of the American Heart Association Guidelines emphasizing intensive lifestyle modifications as the first line treatment for hyperlipidemia. We discussed many lifestyle modifications today in depth, and Dellene will continue to work on decreasing saturated fats such as fatty red meat, butter and many fried foods. She will also increase vegetables and lean protein in her diet and continue to work on exercise and weight loss efforts. We will repeat FLP in 3 months.  Vitamin D Deficiency Denise Strong was informed that low vitamin D levels contributes to fatigue and are associated with obesity, breast, and colon cancer. Denise Strong agrees to start prescription Vit D 50,000 IU every week #4 with no refills. She will follow up for routine testing of vitamin D, at least 2-3 times per year. She was informed of the risk of over-replacement of vitamin D and agrees to not increase her dose unless she discusses this with [place] first. Denise Strong agrees to follow up with our clinic in 2 weeks.  Insulin Resistance Denise Strong will continue to work on weight loss, exercise, and decreasing simple carbohydrates in her diet to help decrease the risk of diabetes. We dicussed metformin including benefits and risks. She was informed that eating too many simple carbohydrates or too many calories at one sitting increases the likelihood of GI side effects. We will recheck  labs in 3 months. Denise Strong agrees to follow up with [place] as directed to monitor her progress.  Diabetes risk counseling Denise Strong was given extended (30 minutes) diabetes prevention counseling today. She is 43 y.o. female and has risk factors for diabetes including obesity and insulin resistance. We discussed intensive lifestyle modifications today with  an emphasis on weight loss as well as increasing exercise and decreasing simple carbohydrates in her diet.  Obesity Denise Strong is currently in the action stage of change. As such, her goal is to continue with weight loss efforts She has agreed to follow the Category 3 plan Denise Strong has been instructed to work up to a goal of 150 minutes of combined cardio and strengthening exercise per week for weight loss and overall health benefits. We discussed the following Behavioral Modification Strategies today: increasing lean protein intake, increasing vegetables and work on meal planning and easy cooking plans, keeping healthy foods in the home, and planning for success   Denise Strong has agreed to follow up with our clinic in 2 weeks. She was informed of the importance of frequent follow up visits to maximize her success with intensive lifestyle modifications for her multiple health conditions.  ALLERGIES: Allergies  Allergen Reactions  . Ceclor [Cefaclor]   . Erythromycin   . Iodine   . Penicillins     MEDICATIONS: Current Outpatient Medications on File Prior to Visit  Medication Sig Dispense Refill  . atorvastatin (LIPITOR) 10 MG tablet Take 1 tablet (10 mg total) by mouth daily. 90 tablet 1  . buPROPion (WELLBUTRIN) 75 MG tablet TAKE 1 TABLET BY MOUTH 2 TIMES DAILY. 180 tablet 0  . loratadine (CLARITIN) 10 MG tablet Take 10 mg by mouth daily.     No current facility-administered medications on file prior to visit.     PAST MEDICAL HISTORY: Past Medical History:  Diagnosis Date  . Anxiety   . Chest pain 02/20/2016  . Constipation   . Elevated fasting blood sugar   . Hyperlipidemia 02/20/2016  . Kidney problem   . Multiple food allergies   . Palpitations 02/20/2016  . Renal disorder     PAST SURGICAL HISTORY: Past Surgical History:  Procedure Laterality Date  . APPENDECTOMY    . CHOLECYSTECTOMY      SOCIAL HISTORY: Social History   Tobacco Use  . Smoking status: Former Smoker     Quit date: 02/20/1995    Years since quitting: 24.3  . Smokeless tobacco: Never Used  . Tobacco comment: social; in college  Substance Use Topics  . Alcohol use: No    Alcohol/week: 0.0 standard drinks  . Drug use: No    FAMILY HISTORY: Family History  Problem Relation Age of Onset  . Diabetes Mother   . Hypertension Father   . Stroke Father   . Heart disease Father   . Sleep apnea Father   . Diabetes Brother   . Diabetes Maternal Grandmother   . AAA (abdominal aortic aneurysm) Maternal Grandfather   . Dementia Paternal Grandfather     ROS: Review of Systems  Constitutional: Positive for malaise/fatigue and weight loss.  Cardiovascular: Negative for chest pain and claudication.  Gastrointestinal: Negative for nausea and vomiting.  Genitourinary: Negative for frequency.  Musculoskeletal: Negative for myalgias.       Negative muscle weakness  Endo/Heme/Allergies: Negative for polydipsia.    PHYSICAL EXAM: Blood pressure 116/63, pulse 69, temperature 98.2 F (36.8 C), temperature source Oral, height 5\' 5"  (1.651 m), weight 182 lb (82.6 kg), SpO2 96 %.  Body mass index is 30.29 kg/m. Physical Exam Vitals signs reviewed.  Constitutional:      Appearance: Normal appearance. She is obese.  Cardiovascular:     Rate and Rhythm: Normal rate.     Pulses: Normal pulses.  Pulmonary:     Effort: Pulmonary effort is normal.     Breath sounds: Normal breath sounds.  Musculoskeletal: Normal range of motion.  Skin:    General: Skin is warm and dry.  Neurological:     Mental Status: She is alert and oriented to person, place, and time.  Psychiatric:        Mood and Affect: Mood normal.        Behavior: Behavior normal.     RECENT LABS AND TESTS: BMET    Component Value Date/Time   NA 143 06/14/2019 1055   K 4.4 06/14/2019 1055   CL 106 06/14/2019 1055   CO2 21 06/14/2019 1055   GLUCOSE 90 06/14/2019 1055   GLUCOSE 85 02/20/2016 1143   BUN 17 06/14/2019 1055    CREATININE 0.52 (L) 06/14/2019 1055   CREATININE 0.56 02/20/2016 1143   CALCIUM 8.9 06/14/2019 1055   GFRNONAA 118 06/14/2019 1055   GFRAA 136 06/14/2019 1055   Lab Results  Component Value Date   HGBA1C 5.5 06/14/2019   HGBA1C 5.5 06/23/2018   Lab Results  Component Value Date   INSULIN 8.9 06/14/2019   CBC    Component Value Date/Time   WBC 6.2 06/14/2019 1055   RBC 4.62 06/14/2019 1055   HGB 13.9 06/14/2019 1055   HCT 40.8 06/14/2019 1055   PLT 307 06/23/2018 0901   MCV 88 06/14/2019 1055   MCH 30.1 06/14/2019 1055   MCHC 34.1 06/14/2019 1055   RDW 12.8 06/14/2019 1055   LYMPHSABS 2.1 06/14/2019 1055   EOSABS 0.1 06/14/2019 1055   BASOSABS 0.1 06/14/2019 1055   Iron/TIBC/Ferritin/ %Sat No results found for: IRON, TIBC, FERRITIN, IRONPCTSAT Lipid Panel     Component Value Date/Time   CHOL 208 (H) 06/14/2019 1055   TRIG 281 (H) 06/14/2019 1055   HDL 40 06/14/2019 1055   CHOLHDL 4.5 (H) 06/23/2018 0901   LDLCALC 82 06/23/2018 0901   Hepatic Function Panel     Component Value Date/Time   PROT 6.9 06/14/2019 1055   ALBUMIN 4.4 06/14/2019 1055   AST 14 06/14/2019 1055   ALT 17 06/14/2019 1055   ALKPHOS 65 06/14/2019 1055   BILITOT 0.3 06/14/2019 1055      Component Value Date/Time   TSH 0.886 06/14/2019 1055   TSH 1.410 06/23/2018 0901   TSH 1.31 02/20/2016 1143      OBESITY BEHAVIORAL INTERVENTION VISIT  Today's visit was # 2   Starting weight: 186 lbs Starting date: 06/14/2019 Today's weight : 182 lbs Today's date: 06/28/2019 Total lbs lost to date: 4    ASK: We discussed the diagnosis of obesity with Denise Strong today and Denise Strong agreed to give [place] permission to discuss obesity behavioral modification therapy today.  ASSESS: Denise Strong has the diagnosis of obesity and her BMI today is 30.29 Denise Strong is in the action stage of change   ADVISE: Denise Strong was educated on the multiple health risks of obesity as well as the benefit of weight loss to  improve her health. She was advised of the need for long term treatment and the importance of lifestyle modifications to improve her current health and to decrease her risk of future health problems.  AGREE: Multiple dietary modification  options and treatment options were discussed and  Denise Strong agreed to follow the recommendations documented in the above note.  ARRANGE: Denise Strong was educated on the importance of frequent visits to treat obesity as outlined per CMS and USPSTF guidelines and agreed to schedule her next follow up appointment today.  I, Trixie Dredge, am acting as transcriptionist for Ilene Qua, MD  I have reviewed the above documentation for accuracy and completeness, and I agree with the above. - Ilene Qua, MD

## 2019-07-10 ENCOUNTER — Telehealth: Payer: Self-pay

## 2019-07-10 ENCOUNTER — Encounter (INDEPENDENT_AMBULATORY_CARE_PROVIDER_SITE_OTHER): Payer: Self-pay | Admitting: Family Medicine

## 2019-07-10 ENCOUNTER — Ambulatory Visit (INDEPENDENT_AMBULATORY_CARE_PROVIDER_SITE_OTHER): Payer: No Typology Code available for payment source | Admitting: Family Medicine

## 2019-07-10 ENCOUNTER — Other Ambulatory Visit: Payer: Self-pay

## 2019-07-10 ENCOUNTER — Other Ambulatory Visit: Payer: Self-pay | Admitting: Family Medicine

## 2019-07-10 VITALS — BP 107/49 | HR 66 | Temp 98.4°F | Ht 65.0 in | Wt 182.0 lb

## 2019-07-10 DIAGNOSIS — E782 Mixed hyperlipidemia: Secondary | ICD-10-CM | POA: Diagnosis not present

## 2019-07-10 DIAGNOSIS — R6882 Decreased libido: Secondary | ICD-10-CM

## 2019-07-10 DIAGNOSIS — R635 Abnormal weight gain: Secondary | ICD-10-CM

## 2019-07-10 DIAGNOSIS — E8881 Metabolic syndrome: Secondary | ICD-10-CM | POA: Diagnosis not present

## 2019-07-10 DIAGNOSIS — Z6830 Body mass index (BMI) 30.0-30.9, adult: Secondary | ICD-10-CM

## 2019-07-10 DIAGNOSIS — E669 Obesity, unspecified: Secondary | ICD-10-CM | POA: Diagnosis not present

## 2019-07-10 DIAGNOSIS — T50905A Adverse effect of unspecified drugs, medicaments and biological substances, initial encounter: Secondary | ICD-10-CM

## 2019-07-10 DIAGNOSIS — F39 Unspecified mood [affective] disorder: Secondary | ICD-10-CM

## 2019-07-10 MED FILL — ATORVASTATIN 10 MG TABLET: 10 | 90 days supply | Qty: 90 | Fill #1

## 2019-07-10 MED FILL — buPROPion HCL 75 MG TABS: 75 | 30 days supply | Qty: 60 | Fill #0

## 2019-07-10 NOTE — Telephone Encounter (Signed)
Please call pt to schedule f/u.  No further refills until pt is seen.  T. Colandra Ohanian, CMA 

## 2019-07-11 NOTE — Progress Notes (Signed)
Office: [phone]  /  Fax: [phone]   HPI:   Chief Complaint: OBESITY Denise Strong is here to discuss her progress with her obesity treatment plan. She is on the Category 3 plan and is following her eating plan approximately 50 % of the time. She states she is exercising 0 minutes 0 times per week. Denise Strong voices the last 2 weeks have been relatively good, but difficult to follow the plan strictly. She is looking for alternatives on how to prepare some of the meat and vegetables. She is going to go out of town for 3 days, she is celebrating her kids birthday.  Her weight is 182 lb (82.6 kg) today and has not lost weight since her last visit. She has lost 4 lbs since starting treatment with [place].  Hyperlipidemia Denise Strong has hyperlipidemia and has been trying to improve her cholesterol levels with intensive lifestyle modification including a low saturated fat diet, exercise and weight loss. Last LDL was of 119, triglycerides of 281, and HDL of 40. She is on statin and denies any chest pain, claudication or myalgias.  Insulin Resistance Denise Strong has a diagnosis of insulin resistance based on her elevated fasting insulin level >5. Last insulin was of 8.9 and Hgb A1c of 5.5. Although Denise Strong's blood glucose readings are still under good control, insulin resistance puts her at greater risk of metabolic syndrome and diabetes. She is not on any medications and continues to work on diet and exercise to decrease risk of diabetes.  ASSESSMENT AND PLAN:  No diagnosis found.  PLAN:  Hyperlipidemia Denise Strong was informed of the American Heart Association Guidelines emphasizing intensive lifestyle modifications as the first line treatment for hyperlipidemia. We discussed many lifestyle modifications today in depth, and Denise Strong will continue to work on decreasing saturated fats such as fatty red meat, butter and many fried foods. She will also increase vegetables and lean protein in her diet and continue to work on  exercise and weight loss efforts. Denise Strong agrees to continue taking statin, and she agrees to follow up with our clinic in 2 weeks.  Insulin Resistance Denise Strong will continue to work on weight loss, exercise, and decreasing simple carbohydrates in her diet to help decrease the risk of diabetes. We dicussed metformin including benefits and risks. She was informed that eating too many simple carbohydrates or too many calories at one sitting increases the likelihood of GI side effects. We will repeat labs in 2 months. Denise Strong agrees to follow up with [place] as directed to monitor her progress.  I spent > than 50% of the 15 minute visit on counseling as documented in the note.  Obesity Denise Strong is currently in the action stage of change. As such, her goal is to continue with weight loss efforts She has agreed to follow the Category 3 plan Denise Strong has been instructed to work up to a goal of 150 minutes of combined cardio and strengthening exercise per week for weight loss and overall health benefits. We discussed the following Behavioral Modification Strategies today: increasing lean protein intake, increasing vegetables and work on meal planning and easy cooking plans, keeping healthy foods in the home, better snacking choices, and planning for success   Denise Strong has agreed to follow up with our clinic in 2 weeks. She was informed of the importance of frequent follow up visits to maximize her success with intensive lifestyle modifications for her multiple health conditions.  ALLERGIES: Allergies  Allergen Reactions  . Ceclor [Cefaclor]   . Erythromycin   .  Iodine   . Penicillins     MEDICATIONS: Current Outpatient Medications on File Prior to Visit  Medication Sig Dispense Refill  . atorvastatin (LIPITOR) 10 MG tablet Take 1 tablet (10 mg total) by mouth daily. 90 tablet 1  . loratadine (CLARITIN) 10 MG tablet Take 10 mg by mouth daily.    . Vitamin D, Ergocalciferol, (DRISDOL) 1.25 MG (50000 UT) CAPS  capsule Take 1 capsule (50,000 Units total) by mouth every 7 (seven) days. 4 capsule 0   No current facility-administered medications on file prior to visit.     PAST MEDICAL HISTORY: Past Medical History:  Diagnosis Date  . Anxiety   . Chest pain 02/20/2016  . Constipation   . Elevated fasting blood sugar   . Hyperlipidemia 02/20/2016  . Kidney problem   . Multiple food allergies   . Palpitations 02/20/2016  . Renal disorder     PAST SURGICAL HISTORY: Past Surgical History:  Procedure Laterality Date  . APPENDECTOMY    . CHOLECYSTECTOMY      SOCIAL HISTORY: Social History   Tobacco Use  . Smoking status: Former Smoker    Quit date: 02/20/1995    Years since quitting: 24.4  . Smokeless tobacco: Never Used  . Tobacco comment: social; in college  Substance Use Topics  . Alcohol use: No    Alcohol/week: 0.0 standard drinks  . Drug use: No    FAMILY HISTORY: Family History  Problem Relation Age of Onset  . Diabetes Mother   . Hypertension Father   . Stroke Father   . Heart disease Father   . Sleep apnea Father   . Diabetes Brother   . Diabetes Maternal Grandmother   . AAA (abdominal aortic aneurysm) Maternal Grandfather   . Dementia Paternal Grandfather     ROS: Review of Systems  Constitutional: Negative for weight loss.  Cardiovascular: Negative for chest pain and claudication.  Musculoskeletal: Negative for myalgias.    PHYSICAL EXAM: Blood pressure (!) 107/49, pulse 66, temperature 98.4 F (36.9 C), height 5\' 5"  (1.651 m), weight 182 lb (82.6 kg), SpO2 98 %. Body mass index is 30.29 kg/m. Physical Exam Vitals signs reviewed.  Constitutional:      Appearance: Normal appearance. She is obese.  Cardiovascular:     Rate and Rhythm: Normal rate.     Pulses: Normal pulses.  Pulmonary:     Effort: Pulmonary effort is normal.     Breath sounds: Normal breath sounds.  Musculoskeletal: Normal range of motion.  Skin:    General: Skin is warm and dry.   Neurological:     Mental Status: She is alert and oriented to person, place, and time.  Psychiatric:        Mood and Affect: Mood normal.        Behavior: Behavior normal.     RECENT LABS AND TESTS: BMET    Component Value Date/Time   NA 143 06/14/2019 1055   K 4.4 06/14/2019 1055   CL 106 06/14/2019 1055   CO2 21 06/14/2019 1055   GLUCOSE 90 06/14/2019 1055   GLUCOSE 85 02/20/2016 1143   BUN 17 06/14/2019 1055   CREATININE 0.52 (L) 06/14/2019 1055   CREATININE 0.56 02/20/2016 1143   CALCIUM 8.9 06/14/2019 1055   GFRNONAA 118 06/14/2019 1055   GFRAA 136 06/14/2019 1055   Lab Results  Component Value Date   HGBA1C 5.5 06/14/2019   HGBA1C 5.5 06/23/2018   Lab Results  Component Value Date   INSULIN  8.9 06/14/2019   CBC    Component Value Date/Time   WBC 6.2 06/14/2019 1055   RBC 4.62 06/14/2019 1055   HGB 13.9 06/14/2019 1055   HCT 40.8 06/14/2019 1055   PLT 307 06/23/2018 0901   MCV 88 06/14/2019 1055   MCH 30.1 06/14/2019 1055   MCHC 34.1 06/14/2019 1055   RDW 12.8 06/14/2019 1055   LYMPHSABS 2.1 06/14/2019 1055   EOSABS 0.1 06/14/2019 1055   BASOSABS 0.1 06/14/2019 1055   Iron/TIBC/Ferritin/ %Sat No results found for: IRON, TIBC, FERRITIN, IRONPCTSAT Lipid Panel     Component Value Date/Time   CHOL 208 (H) 06/14/2019 1055   TRIG 281 (H) 06/14/2019 1055   HDL 40 06/14/2019 1055   CHOLHDL 4.5 (H) 06/23/2018 0901   LDLCALC 119 (H) 06/14/2019 1055   Hepatic Function Panel     Component Value Date/Time   PROT 6.9 06/14/2019 1055   ALBUMIN 4.4 06/14/2019 1055   AST 14 06/14/2019 1055   ALT 17 06/14/2019 1055   ALKPHOS 65 06/14/2019 1055   BILITOT 0.3 06/14/2019 1055      Component Value Date/Time   TSH 0.886 06/14/2019 1055   TSH 1.410 06/23/2018 0901   TSH 1.31 02/20/2016 1143      OBESITY BEHAVIORAL INTERVENTION VISIT  Today's visit was # 3   Starting weight: 186 lbs Starting date: 06/14/2019 Today's weight : 182 lbs Today's date:  07/10/2019 Total lbs lost to date: 4    ASK: We discussed the diagnosis of obesity with Cianna A Biscardi today and Keeanna agreed to give [place] permission to discuss obesity behavioral modification therapy today.  ASSESS: Markeeta has the diagnosis of obesity and her BMI today is 30.29 Dainelle is in the action stage of change   ADVISE: Jossalyn was educated on the multiple health risks of obesity as well as the benefit of weight loss to improve her health. She was advised of the need for long term treatment and the importance of lifestyle modifications to improve her current health and to decrease her risk of future health problems.  AGREE: Multiple dietary modification options and treatment options were discussed and  Gabi agreed to follow the recommendations documented in the above note.  ARRANGE: Nishita was educated on the importance of frequent visits to treat obesity as outlined per CMS and USPSTF guidelines and agreed to schedule her next follow up appointment today.  I, Trixie Dredge, am acting as transcriptionist for Ilene Qua, MD  I have reviewed the above documentation for accuracy and completeness, and I agree with the above. - Ilene Qua, MD

## 2019-07-19 MED FILL — buPROPion HCL 75 MG TABS: 75 | 30 days supply | Qty: 60 | Fill #0

## 2019-07-24 ENCOUNTER — Encounter (INDEPENDENT_AMBULATORY_CARE_PROVIDER_SITE_OTHER): Payer: Self-pay | Admitting: Family Medicine

## 2019-07-24 ENCOUNTER — Ambulatory Visit (INDEPENDENT_AMBULATORY_CARE_PROVIDER_SITE_OTHER): Payer: No Typology Code available for payment source | Admitting: Family Medicine

## 2019-07-24 ENCOUNTER — Other Ambulatory Visit: Payer: Self-pay

## 2019-07-24 VITALS — BP 123/78 | HR 67 | Temp 98.1°F | Ht 65.0 in | Wt 183.0 lb

## 2019-07-24 DIAGNOSIS — E669 Obesity, unspecified: Secondary | ICD-10-CM

## 2019-07-24 DIAGNOSIS — Z9189 Other specified personal risk factors, not elsewhere classified: Secondary | ICD-10-CM

## 2019-07-24 DIAGNOSIS — E559 Vitamin D deficiency, unspecified: Secondary | ICD-10-CM

## 2019-07-24 DIAGNOSIS — E7849 Other hyperlipidemia: Secondary | ICD-10-CM

## 2019-07-24 DIAGNOSIS — Z6830 Body mass index (BMI) 30.0-30.9, adult: Secondary | ICD-10-CM

## 2019-07-24 MED ORDER — ATORVASTATIN CALCIUM 10 MG PO TABS: 10.0000 mg | ORAL_TABLET | Freq: Every day | ORAL | 1 refills | Status: DC

## 2019-07-24 MED FILL — ATORVASTATIN 10 MG TABLET: 10 | 90 days supply | Qty: 90 | Fill #0

## 2019-07-26 ENCOUNTER — Telehealth: Payer: Self-pay | Admitting: Family Medicine

## 2019-07-26 NOTE — Telephone Encounter (Signed)
Called patient to set up to provider required OV/ Telehealth see below:     Please call pt to schedule f/u.  No further refills until pt is seen.  Charyl Bigger, CMA FYI to med asst u/a to leave message pt's voicemail box full--glh

## 2019-07-26 NOTE — Progress Notes (Signed)
Office: [phone]  /  Fax: [phone]   HPI:   Chief Complaint: OBESITY Denise Strong is here to discuss her progress with her obesity treatment plan. She is on the Category 3 plan and is following her eating plan approximately 0 % of the time. She states she is walking 2 miles times per week. Denise Strong just got back from a trip to [place]. She is back on the plan. She does like the food on the Category 3 plan but she is bored with her options at times. Denise Strong is doing a great job with meal prepping. Her weight is 183 lb (83 kg) today and has had a weight gain of 1 pound over a period of 2 weeks since her last visit. She has lost 3 lbs since starting treatment with [place].  Hyperlipidemia Denise Strong has hyperlipidemia and her LDL is elevated at 119, HDL is low at 40 and triglycerides are high at 281. She has been trying to improve her cholesterol levels with intensive lifestyle modification including a low saturated fat diet, exercise and weight loss. She denies any chest pain or shortness of breath. The 10-year ASCVD risk score Denise Bussing DC Brooke Bonito., et al., 2013) is: 1.2%   Values used to calculate the score:     Age: 42 years     Sex: Female     Is Non-Hispanic African American: No     Diabetic: No     Tobacco smoker: No     Systolic Blood Pressure: [id number]     Is BP treated: No     HDL Cholesterol: 40 mg/dL     Total Cholesterol: 208 mg/dL   At risk for cardiovascular disease Denise Strong is at a higher than average risk for cardiovascular disease due to obesity and hyperlipidemia. She currently denies any chest pain.  Vitamin D deficiency Denise Strong has a diagnosis of vitamin D deficiency. She is currently taking vit D. Her last vitamin D level was at 23.3 on 06/14/19 and was not at goal. She denies nausea, vomiting or muscle weakness.  ASSESSMENT AND PLAN:  Other hyperlipidemia - Plan: atorvastatin (LIPITOR) 10 MG tablet  Vitamin D deficiency  At risk for heart disease  Class 1 obesity with serious  comorbidity and body mass index (BMI) of 30.0 to 30.9 in adult, unspecified obesity type  PLAN:  Hyperlipidemia Denise Strong was informed of the American Heart Association Guidelines emphasizing intensive lifestyle modifications as the first line treatment for hyperlipidemia. We discussed many lifestyle modifications today in depth, and Denise Strong will continue to work on decreasing saturated fats such as fatty red meat, butter and many fried foods. She will also increase vegetables and lean protein in her diet and continue to work on exercise and weight loss efforts. We will check fasting lipid panel in 2 months. Denise Strong agrees to continue Lipitor 10 mg daily #90 with no refills and follow up as directed.   Cardiovascular risk counseling Denise Strong was given extended (15 minutes) coronary artery disease prevention counseling today. She is 43 y.o. female and has risk factors for heart disease including obesity and hyperlipidemia. We discussed intensive lifestyle modifications today with an emphasis on specific weight loss instructions and strategies. Denise Strong was also informed of the importance of increasing exercise and decreasing saturated fats to help prevent heart disease.  Vitamin D Deficiency Denise Strong was informed that low vitamin D levels contributes to fatigue and are associated with obesity, breast, and colon cancer. Denise Strong will continue to take prescription Vit D @50 ,000 IU every week and  she will follow up for routine testing of vitamin D, at least 2-3 times per year. She was informed of the risk of over-replacement of vitamin D and agrees to not increase her dose unless she discusses this with [place] first. We will check vitamin D level in 2 months and Denise Strong will follow up as directed.  Obesity Denise Strong is currently in the action stage of change. As such, her goal is to continue with weight loss efforts She has agreed to follow the Category 3 plan Denise Strong will continue walking 2 miles, 3 times per week for weight loss and  overall health benefits. We discussed the following Behavioral Modification Strategies today: planning for success, increase H2O intake, increasing lean protein intake and work on meal planning and easy cooking plans  Handouts for recipes were given to patient today.  Kynzi has agreed to follow up with our clinic in 2 weeks. She was informed of the importance of frequent follow up visits to maximize her success with intensive lifestyle modifications for her multiple health conditions.  ALLERGIES: Allergies  Allergen Reactions  . Ceclor [Cefaclor]   . Erythromycin   . Iodine   . Penicillins     MEDICATIONS: Current Outpatient Medications on File Prior to Visit  Medication Sig Dispense Refill  . buPROPion (WELLBUTRIN) 75 MG tablet Take 1 tablet (75 mg total) by mouth 2 (two) times daily. OFFICE VISIT REQUIRED PRIOR TO ANY FURTHER REFILLS 60 tablet 0  . loratadine (CLARITIN) 10 MG tablet Take 10 mg by mouth daily.    . Vitamin D, Ergocalciferol, (DRISDOL) 1.25 MG (50000 UT) CAPS capsule Take 1 capsule (50,000 Units total) by mouth every 7 (seven) days. 4 capsule 0   No current facility-administered medications on file prior to visit.     PAST MEDICAL HISTORY: Past Medical History:  Diagnosis Date  . Anxiety   . Chest pain 02/20/2016  . Constipation   . Elevated fasting blood sugar   . Hyperlipidemia 02/20/2016  . Kidney problem   . Multiple food allergies   . Palpitations 02/20/2016  . Renal disorder     PAST SURGICAL HISTORY: Past Surgical History:  Procedure Laterality Date  . APPENDECTOMY    . CHOLECYSTECTOMY      SOCIAL HISTORY: Social History   Tobacco Use  . Smoking status: Former Smoker    Quit date: 02/20/1995    Years since quitting: 24.4  . Smokeless tobacco: Never Used  . Tobacco comment: social; in college  Substance Use Topics  . Alcohol use: No    Alcohol/week: 0.0 standard drinks  . Drug use: No    FAMILY HISTORY: Family History  Problem Relation  Age of Onset  . Diabetes Mother   . Hypertension Father   . Stroke Father   . Heart disease Father   . Sleep apnea Father   . Diabetes Brother   . Diabetes Maternal Grandmother   . AAA (abdominal aortic aneurysm) Maternal Grandfather   . Dementia Paternal Grandfather     ROS: Review of Systems  Constitutional: Negative for weight loss.  Respiratory: Negative for shortness of breath.   Cardiovascular: Negative for chest pain.  Gastrointestinal: Negative for nausea and vomiting.  Musculoskeletal:       Negative for muscle weakness    PHYSICAL EXAM: Blood pressure 123/78, pulse 67, temperature 98.1 F (36.7 C), temperature source Oral, height 5\' 5"  (1.651 m), weight 183 lb (83 kg), SpO2 97 %. Body mass index is 30.45 kg/m. Physical Exam Vitals signs  reviewed.  Constitutional:      Appearance: Normal appearance. She is well-developed. She is obese.  Cardiovascular:     Rate and Rhythm: Normal rate.  Pulmonary:     Effort: Pulmonary effort is normal.  Musculoskeletal: Normal range of motion.  Skin:    General: Skin is warm and dry.  Neurological:     Mental Status: She is alert and oriented to person, place, and time.  Psychiatric:        Mood and Affect: Mood normal.        Behavior: Behavior normal.     RECENT LABS AND TESTS: BMET    Component Value Date/Time   NA 143 06/14/2019 1055   K 4.4 06/14/2019 1055   CL 106 06/14/2019 1055   CO2 21 06/14/2019 1055   GLUCOSE 90 06/14/2019 1055   GLUCOSE 85 02/20/2016 1143   BUN 17 06/14/2019 1055   CREATININE 0.52 (L) 06/14/2019 1055   CREATININE 0.56 02/20/2016 1143   CALCIUM 8.9 06/14/2019 1055   GFRNONAA 118 06/14/2019 1055   GFRAA 136 06/14/2019 1055   Lab Results  Component Value Date   HGBA1C 5.5 06/14/2019   HGBA1C 5.5 06/23/2018   Lab Results  Component Value Date   INSULIN 8.9 06/14/2019   CBC    Component Value Date/Time   WBC 6.2 06/14/2019 1055   RBC 4.62 06/14/2019 1055   HGB 13.9  06/14/2019 1055   HCT 40.8 06/14/2019 1055   PLT 307 06/23/2018 0901   MCV 88 06/14/2019 1055   MCH 30.1 06/14/2019 1055   MCHC 34.1 06/14/2019 1055   RDW 12.8 06/14/2019 1055   LYMPHSABS 2.1 06/14/2019 1055   EOSABS 0.1 06/14/2019 1055   BASOSABS 0.1 06/14/2019 1055   Iron/TIBC/Ferritin/ %Sat No results found for: IRON, TIBC, FERRITIN, IRONPCTSAT Lipid Panel     Component Value Date/Time   CHOL 208 (H) 06/14/2019 1055   TRIG 281 (H) 06/14/2019 1055   HDL 40 06/14/2019 1055   CHOLHDL 4.5 (H) 06/23/2018 0901   LDLCALC 119 (H) 06/14/2019 1055   Hepatic Function Panel     Component Value Date/Time   PROT 6.9 06/14/2019 1055   ALBUMIN 4.4 06/14/2019 1055   AST 14 06/14/2019 1055   ALT 17 06/14/2019 1055   ALKPHOS 65 06/14/2019 1055   BILITOT 0.3 06/14/2019 1055      Component Value Date/Time   TSH 0.886 06/14/2019 1055   TSH 1.410 06/23/2018 0901   TSH 1.31 02/20/2016 1143     Ref. Range 06/14/2019 10:55  Vitamin D, 25-Hydroxy Latest Ref Range: 30.0 - 100.0 ng/mL 23.3 (L)   OBESITY BEHAVIORAL INTERVENTION VISIT  Today's visit was # 4   Starting weight: 186 lbs Starting date: 06/14/2019 Today's weight : 183 lbs Today's date: 07/24/2019 Total lbs lost to date: 3    07/24/2019  Height 5\' 5"  (1.651 m)  Weight 183 lb (83 kg)  BMI (Calculated) 30.45  BLOOD PRESSURE - SYSTOLIC [id number]  BLOOD PRESSURE - DIASTOLIC 78   Body Fat % 0000000 %  Total Body Water (lbs) 77.6 lbs    ASK: We discussed the diagnosis of obesity with Arlet A Haeberle today and Dajanee agreed to give [place] permission to discuss obesity behavioral modification therapy today.  ASSESS: Jessie has the diagnosis of obesity and her BMI today is 30.45 Yoona is in the action stage of change   ADVISE: Chanice was educated on the multiple health risks of obesity as well as the benefit of  weight loss to improve her health. She was advised of the need for long term treatment and the importance of lifestyle modifications  to improve her current health and to decrease her risk of future health problems.  AGREE: Multiple dietary modification options and treatment options were discussed and  Michaline agreed to follow the recommendations documented in the above note.  ARRANGE: Nicholl was educated on the importance of frequent visits to treat obesity as outlined per CMS and USPSTF guidelines and agreed to schedule her next follow up appointment today.  Corey Skains, am acting as Location manager for [employer], FNP-C.  I have reviewed the above documentation for accuracy and completeness, and I agree with the above.  - Maayan Jenning, FNP-C.

## 2019-07-27 ENCOUNTER — Encounter (INDEPENDENT_AMBULATORY_CARE_PROVIDER_SITE_OTHER): Payer: Self-pay | Admitting: Family Medicine

## 2019-07-27 DIAGNOSIS — Z6830 Body mass index (BMI) 30.0-30.9, adult: Secondary | ICD-10-CM | POA: Insufficient documentation

## 2019-07-27 DIAGNOSIS — E669 Obesity, unspecified: Secondary | ICD-10-CM | POA: Insufficient documentation

## 2019-07-27 DIAGNOSIS — E559 Vitamin D deficiency, unspecified: Secondary | ICD-10-CM | POA: Insufficient documentation

## 2019-08-10 ENCOUNTER — Encounter (INDEPENDENT_AMBULATORY_CARE_PROVIDER_SITE_OTHER): Payer: Self-pay | Admitting: Family Medicine

## 2019-08-10 ENCOUNTER — Other Ambulatory Visit: Payer: Self-pay

## 2019-08-10 ENCOUNTER — Ambulatory Visit (INDEPENDENT_AMBULATORY_CARE_PROVIDER_SITE_OTHER): Payer: No Typology Code available for payment source | Admitting: Family Medicine

## 2019-08-10 VITALS — BP 95/58 | HR 64 | Temp 98.6°F | Ht 65.0 in | Wt 180.0 lb

## 2019-08-10 DIAGNOSIS — E8881 Metabolic syndrome: Secondary | ICD-10-CM

## 2019-08-10 DIAGNOSIS — Z6830 Body mass index (BMI) 30.0-30.9, adult: Secondary | ICD-10-CM

## 2019-08-10 DIAGNOSIS — E669 Obesity, unspecified: Secondary | ICD-10-CM

## 2019-08-10 DIAGNOSIS — Z9189 Other specified personal risk factors, not elsewhere classified: Secondary | ICD-10-CM | POA: Diagnosis not present

## 2019-08-10 DIAGNOSIS — E559 Vitamin D deficiency, unspecified: Secondary | ICD-10-CM

## 2019-08-10 MED ORDER — VITAMIN D (ERGOCALCIFEROL) 1.25 MG (50000 UNIT) PO CAPS: 50000.0000 [IU] | ORAL_CAPSULE | ORAL | 0 refills | Status: DC

## 2019-08-10 MED FILL — VIT D2 1.25 MG (50,000 UNIT: 1.25 MG | 28 days supply | Qty: 4 | Fill #0

## 2019-08-10 NOTE — Progress Notes (Signed)
Office: [phone]  /  Fax: [phone]   HPI:   Chief Complaint: OBESITY Denise Strong is here to discuss her progress with her obesity treatment plan. She is on the Category 3 plan and is following her eating plan approximately 65 % of the time. She states she is exercising 0 minutes 0 times per week. Denise Strong is on the plan at breakfast, but lunch may be very late on the days that she works. Denise Strong works 3 days per week and is able to stick to the plan much better on her days off. She is going to [place] [employer] in 2 weeks with her wife and kids and plans on trying to make healthy choices but not ticking to the die texactly. Marland Kitchen  Her weight is 180 lb (81.6 kg) today and has had a weight loss of 3 pounds over a period of 2 weeks since her last visit. She has lost 6 lbs since starting treatment with [place].  Vitamin D Deficiency Denise Strong has a diagnosis of vitamin D deficiency. She is currently on vit D and is not at goal. Her last vitamin D level was 23.3 on 06/14/19. Denise Strong denies nausea, vomiting, or muscle weakness.  At risk for osteopenia and osteoporosis Denise Strong is at higher risk of osteopenia and osteoporosis due to vitamin D deficiency.   Insulin Resistance Denise Strong has a diagnosis of insulin resistance based on her elevated fasting insulin level >5. Although Denise Strong's blood glucose readings are still under good control, insulin resistance puts her at greater risk of metabolic syndrome and diabetes. She is not taking metformin currently and continues to work on diet and exercise to decrease risk of diabetes. Denise Strong notes polyphagia after lunch. Lab Results  Component Value Date   HGBA1C 5.5 06/14/2019    ASSESSMENT AND PLAN:  Vitamin D deficiency - Plan: Vitamin D, Ergocalciferol, (DRISDOL) 1.25 MG (50000 UT) CAPS capsule  Insulin resistance  At risk for osteoporosis  Class 1 obesity with serious comorbidity and body mass index (BMI) of 30.0 to 30.9 in adult, unspecified obesity type  PLAN:  Vitamin D  Deficiency Denise Strong was informed that low vitamin D levels contribute to fatigue and are associated with obesity, breast, and colon cancer. [name] agrees to continue to take prescription Vit D @50 ,000 IU every week #4 with no refills and will follow up for routine testing of vitamin D, at least 2-3 times per year. She was informed of the risk of over-replacement of vitamin D and agrees to not increase her dose unless she discusses this with [place] first. Denise Strong agrees to follow up in 3 weeks as directed.  At risk for osteopenia and osteoporosis Denise Strong was given extended (15 minutes) osteoporosis prevention counseling today. Denise Strong is at risk for osteopenia and osteoporosis due to her vitamin D deficiency. She was encouraged to take her vitamin D and follow her higher calcium diet and increase strengthening exercise to help strengthen her bones and decrease her risk of osteopenia and osteoporosis.  Insulin Resistance Denise Strong will continue to work on weight loss, exercise, and decreasing simple carbohydrates in her diet to help decrease the risk of diabetes. We discussed metformin including benefits and risks and Denise Strong would like to hold off.  Denise Strong agreed to continue her meal plan and she will follow up with [place] as directed to monitor her progress.   Obesity Denise Strong is currently in the action stage of change. As such, her goal is to continue with weight loss efforts. She has agreed to follow the Category 3  plan. We discussed the following Behavioral Modification Strategies today: increasing lean protein intake, decreasing simple carbohydrates, travel eating strategies, and work on meal planning and easy cooking plans. We discussed having a heavier breakfast on work days.  Denise Strong has agreed to follow up with our clinic in 3 weeks. She was informed of the importance of frequent follow up visits to maximize her success with intensive lifestyle modifications for her multiple health conditions.  ALLERGIES: Allergies   Allergen Reactions  . Ceclor [Cefaclor]   . Erythromycin   . Iodine   . Penicillins     MEDICATIONS: Current Outpatient Medications on File Prior to Visit  Medication Sig Dispense Refill  . atorvastatin (LIPITOR) 10 MG tablet Take 1 tablet (10 mg total) by mouth daily. 90 tablet 1  . buPROPion (WELLBUTRIN) 75 MG tablet Take 1 tablet (75 mg total) by mouth 2 (two) times daily. OFFICE VISIT REQUIRED PRIOR TO ANY FURTHER REFILLS 60 tablet 0  . loratadine (CLARITIN) 10 MG tablet Take 10 mg by mouth daily.     No current facility-administered medications on file prior to visit.     PAST MEDICAL HISTORY: Past Medical History:  Diagnosis Date  . Anxiety   . Chest pain 02/20/2016  . Constipation   . Elevated fasting blood sugar   . Hyperlipidemia 02/20/2016  . Kidney problem   . Multiple food allergies   . Palpitations 02/20/2016  . Renal disorder     PAST SURGICAL HISTORY: Past Surgical History:  Procedure Laterality Date  . APPENDECTOMY    . CHOLECYSTECTOMY      SOCIAL HISTORY: Social History   Tobacco Use  . Smoking status: Former Smoker    Quit date: 02/20/1995    Years since quitting: 24.4  . Smokeless tobacco: Never Used  . Tobacco comment: social; in college  Substance Use Topics  . Alcohol use: No    Alcohol/week: 0.0 standard drinks  . Drug use: No    FAMILY HISTORY: Family History  Problem Relation Age of Onset  . Diabetes Mother   . Hypertension Father   . Stroke Father   . Heart disease Father   . Sleep apnea Father   . Diabetes Brother   . Diabetes Maternal Grandmother   . AAA (abdominal aortic aneurysm) Maternal Grandfather   . Dementia Paternal Grandfather     ROS: Review of Systems  Constitutional: Positive for weight loss.  Gastrointestinal: Negative for nausea and vomiting.  Musculoskeletal:       Negative for muscle weakness.  Endo/Heme/Allergies:       Positive for polyphagia.    PHYSICAL EXAM: Blood pressure (!) 95/58, pulse 64,  temperature 98.6 F (37 C), temperature source Oral, height 5\' 5"  (1.651 m), weight 180 lb (81.6 kg), last menstrual period 07/13/2019, SpO2 97 %. Body mass index is 29.95 kg/m. Physical Exam Vitals signs reviewed.  Constitutional:      Appearance: Normal appearance. She is obese.  Cardiovascular:     Rate and Rhythm: Normal rate.  Pulmonary:     Effort: Pulmonary effort is normal.  Musculoskeletal: Normal range of motion.  Skin:    General: Skin is warm and dry.  Neurological:     Mental Status: She is alert and oriented to person, place, and time.  Psychiatric:        Mood and Affect: Mood normal.        Behavior: Behavior normal.     RECENT LABS AND TESTS: BMET    Component Value Date/Time  NA 143 06/14/2019 1055   K 4.4 06/14/2019 1055   CL 106 06/14/2019 1055   CO2 21 06/14/2019 1055   GLUCOSE 90 06/14/2019 1055   GLUCOSE 85 02/20/2016 1143   BUN 17 06/14/2019 1055   CREATININE 0.52 (L) 06/14/2019 1055   CREATININE 0.56 02/20/2016 1143   CALCIUM 8.9 06/14/2019 1055   GFRNONAA 118 06/14/2019 1055   GFRAA 136 06/14/2019 1055   Lab Results  Component Value Date   HGBA1C 5.5 06/14/2019   HGBA1C 5.5 06/23/2018   Lab Results  Component Value Date   INSULIN 8.9 06/14/2019   CBC    Component Value Date/Time   WBC 6.2 06/14/2019 1055   RBC 4.62 06/14/2019 1055   HGB 13.9 06/14/2019 1055   HCT 40.8 06/14/2019 1055   PLT 307 06/23/2018 0901   MCV 88 06/14/2019 1055   MCH 30.1 06/14/2019 1055   MCHC 34.1 06/14/2019 1055   RDW 12.8 06/14/2019 1055   LYMPHSABS 2.1 06/14/2019 1055   EOSABS 0.1 06/14/2019 1055   BASOSABS 0.1 06/14/2019 1055   Iron/TIBC/Ferritin/ %Sat No results found for: IRON, TIBC, FERRITIN, IRONPCTSAT Lipid Panel     Component Value Date/Time   CHOL 208 (H) 06/14/2019 1055   TRIG 281 (H) 06/14/2019 1055   HDL 40 06/14/2019 1055   CHOLHDL 4.5 (H) 06/23/2018 0901   LDLCALC 119 (H) 06/14/2019 1055   Hepatic Function Panel      Component Value Date/Time   PROT 6.9 06/14/2019 1055   ALBUMIN 4.4 06/14/2019 1055   AST 14 06/14/2019 1055   ALT 17 06/14/2019 1055   ALKPHOS 65 06/14/2019 1055   BILITOT 0.3 06/14/2019 1055      Component Value Date/Time   TSH 0.886 06/14/2019 1055   TSH 1.410 06/23/2018 0901   TSH 1.31 02/20/2016 1143   Results for AMBERNICOLE, USREY A (MRN [id number]) as of 08/10/2019 14:39  Ref. Range 06/14/2019 10:55  Vitamin D, 25-Hydroxy Latest Ref Range: 30.0 - 100.0 ng/mL 23.3 (L)   OBESITY BEHAVIORAL INTERVENTION VISIT  Today's visit was # 5  Starting weight: 186 lbs Starting date: 06/14/19 Today's weight : Weight: 180 lb (81.6 kg)  Today's date: 08/10/2019 Total lbs lost to date: 6    08/10/2019  Height 5\' 5"  (1.651 m)  Weight 180 lb (81.6 kg)  BMI (Calculated) 29.95  BLOOD PRESSURE - SYSTOLIC 95  BLOOD PRESSURE - DIASTOLIC 58   Body Fat % [id number] %  Total Body Water (lbs) 74.2 lbs   ASK: We discussed the diagnosis of obesity with Nieves A Harmsen today and Tomie agreed to give [place] permission to discuss obesity behavioral modification therapy today.  ASSESS: Michaelyn has the diagnosis of obesity and her BMI today is 29.95. Jaydan is in the action stage of change.   ADVISE: Brisia was educated on the multiple health risks of obesity as well as the benefit of weight loss to improve her health. She was advised of the need for long term treatment and the importance of lifestyle modifications to improve her current health and to decrease her risk of future health problems.  AGREE: Multiple dietary modification options and treatment options were discussed and Forrest agreed to follow the recommendations documented in the above note.  ARRANGE: Nichoel was educated on the importance of frequent visits to treat obesity as outlined per CMS and USPSTF guidelines and agreed to schedule her next follow up appointment today.  IMarcille Blanco, CMA, am acting as Location manager for [employer],  FNP-C.  I have reviewed the above documentation for accuracy and completeness, and I agree with the above.  - Zara Wendt, FNP-C.

## 2019-08-11 ENCOUNTER — Encounter (INDEPENDENT_AMBULATORY_CARE_PROVIDER_SITE_OTHER): Payer: Self-pay | Admitting: Family Medicine

## 2019-08-11 DIAGNOSIS — E8881 Metabolic syndrome: Secondary | ICD-10-CM | POA: Insufficient documentation

## 2019-08-11 DIAGNOSIS — R7303 Prediabetes: Secondary | ICD-10-CM | POA: Insufficient documentation

## 2019-08-31 ENCOUNTER — Other Ambulatory Visit: Payer: Self-pay | Admitting: Family Medicine

## 2019-08-31 ENCOUNTER — Telehealth: Payer: Self-pay | Admitting: Family Medicine

## 2019-08-31 DIAGNOSIS — F39 Unspecified mood [affective] disorder: Secondary | ICD-10-CM

## 2019-08-31 DIAGNOSIS — R635 Abnormal weight gain: Secondary | ICD-10-CM

## 2019-08-31 DIAGNOSIS — R6882 Decreased libido: Secondary | ICD-10-CM

## 2019-08-31 MED FILL — buPROPion HCL 75 MG TABS: 75 | 15 days supply | Qty: 30 | Fill #0

## 2019-08-31 NOTE — Telephone Encounter (Signed)
Patient understands that she needs a med f/u for refills and has scheduled a doxy med f/u for 09/14/19. But she is currently out of her Wellbutrin and is hoping that Dr. Jenetta Downer will send in at minimum a small refill to get her by. Please advise if this is something we can do and if approved please send to [facility].

## 2019-08-31 NOTE — Telephone Encounter (Signed)
15 day supply has been sent to pharmacy. Patient is aware. AS, CMA

## 2019-09-13 ENCOUNTER — Ambulatory Visit (INDEPENDENT_AMBULATORY_CARE_PROVIDER_SITE_OTHER): Payer: No Typology Code available for payment source | Admitting: Family Medicine

## 2019-09-14 ENCOUNTER — Ambulatory Visit (INDEPENDENT_AMBULATORY_CARE_PROVIDER_SITE_OTHER): Payer: No Typology Code available for payment source | Admitting: Family Medicine

## 2019-09-14 ENCOUNTER — Other Ambulatory Visit: Payer: Self-pay

## 2019-09-14 ENCOUNTER — Encounter: Payer: Self-pay | Admitting: Family Medicine

## 2019-09-14 VITALS — BP 116/72 | Ht 65.0 in | Wt 180.4 lb

## 2019-09-14 DIAGNOSIS — E7849 Other hyperlipidemia: Secondary | ICD-10-CM

## 2019-09-14 DIAGNOSIS — E669 Obesity, unspecified: Secondary | ICD-10-CM | POA: Diagnosis not present

## 2019-09-14 DIAGNOSIS — Z9189 Other specified personal risk factors, not elsewhere classified: Secondary | ICD-10-CM

## 2019-09-14 DIAGNOSIS — E8881 Metabolic syndrome: Secondary | ICD-10-CM | POA: Diagnosis not present

## 2019-09-14 DIAGNOSIS — S86112A Strain of other muscle(s) and tendon(s) of posterior muscle group at lower leg level, left leg, initial encounter: Secondary | ICD-10-CM

## 2019-09-14 DIAGNOSIS — E559 Vitamin D deficiency, unspecified: Secondary | ICD-10-CM

## 2019-09-14 DIAGNOSIS — Z6830 Body mass index (BMI) 30.0-30.9, adult: Secondary | ICD-10-CM

## 2019-09-14 NOTE — Progress Notes (Signed)
Live Video office visit note for Denise Strong, D.O- at Primary Care at [facility]   I connected with current patient today and verified that I am speaking with the correct person using two identifiers.    Location of the patient: Home  Location of the provider: Office Only the patient (+/- their family members at pt's discretion) and myself were participating in the encounter - This visit type was conducted due to national recommendations for restrictions regarding the COVID-19 Pandemic (e.g. social distancing) in an effort to limit this patient's exposure and mitigate transmission in our community.  This format is felt to be most appropriate for this patient at this time.   - The patient did have access to video technology however, in middle of OV, she had technical difficulties with video requiring transitioning to audio format only. - No physical exam could be performed with this format, beyond that communicated to [place] by the patient/ family members as noted.   - Additionally my office staff/ schedulers discussed with the patient that there may be a monetary charge related to this service, depending on their medical insurance.   The patient expressed understanding, and agreed to proceed.       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  Says she's been doing well during the pandemic, all things considered.  Notes her kids are a lot more resilient than she is, "and they don't know any different."  They are home schooling this week, and the kids return to school in-person next week.  - Following up with [facility] Notes when her wife Nira Conn got the call to go to [facility], the patient and some friends worked on getting accepted "to go to fat camp."  Notes "It's going okay."  Says "I understand the concept and the eating, but trying to get all that food in is a problem."  Says "I enjoy it, I  enjoy the program, it just is what it is."  Is currently following up with Dr. Tomasa Rand while Dr. Adair Patter is out with her new baby.  Notes "they're really good motivators and teachers."  Notes her highest weight this year was 196 lbs, and she's currently down to 180.4.  States "I can get into some jeans that I couldn't get into last year."  Their goal is a pound of weight loss per week.  - Left Inside Calf Pain Notes while playing softball this summer, she had a misstep in which it felt like her calf muscle tore. She has not obtained follow-up for this.  Notes sometimes if she steps wrong, "it will catch."  States one time after the incident, she was running at work due to a patient code, and had a hard time walking for the rest of the day.  HPI:  Hyperlipidemia:  43 y.o. female here for cholesterol follow-up.   Notes she's been managed on Lipitor in the past, since her former PCP.  Not currently taking her Lipitor regularly.  She still has her prescription, "I just don't take it like I used to with this calf pain."  Notes the Lipitor makes her legs hurt so bad, "and I probably don't drink enough water.  I don't hydrate like I should; I read that's one of the side-effects, that if you don't drink enough, muscle aches are more prominent."  - She denies new onset of: myalgias, arthralgias, increased fatigue more than  normal, chest pains, exercise intolerance, shortness of breath, dizziness, visual changes, headache, lower extremity swelling or claudication.   Most recent cholesterol panel was:  Lab Results  Component Value Date   CHOL 208 (H) 06/14/2019   HDL 40 06/14/2019   LDLCALC 119 (H) 06/14/2019   TRIG 281 (H) 06/14/2019   CHOLHDL 4.5 (H) 06/23/2018   Hepatic Function Latest Ref Rng & Units 06/14/2019 06/23/2018 01/02/2016  Total Protein 6.0 - 8.5 g/dL 6.9 6.9 -  Albumin 3.8 - 4.8 g/dL 4.4 4.3 -  AST 0 - 40 IU/L 14 18 11(A)  ALT 0 - 32 IU/L 17 21 12   Alk Phosphatase 39 - 117 IU/L  65 69 61  Total Bilirubin 0.0 - 1.2 mg/dL 0.3 0.4 -      GAD 7 : Generalized Anxiety Score 07/19/2018  Nervous, Anxious, on Edge 0  Control/stop worrying 0  Worry too much - different things 0  Trouble relaxing 0  Restless 0  Easily annoyed or irritable 0  Afraid - awful might happen 0  Total GAD 7 Score 0  Anxiety Difficulty Not difficult at all    Depression screen [facility] 2/9 09/14/2019 06/14/2019 10/19/2018 07/19/2018  Decreased Interest 0 1 0 0  Down, Depressed, Hopeless 0 1 0 0  PHQ - 2 Score 0 2 0 0  Altered sleeping 0 0 1 0  Tired, decreased energy 0 2 0 0  Change in appetite 0 0 0 0  Feeling bad or failure about yourself  0 1 0 0  Trouble concentrating 0 0 0 0  Moving slowly or fidgety/restless 0 0 0 0  Suicidal thoughts 0 0 0 0  PHQ-9 Score 0 5 1 0  Difficult doing work/chores - Not difficult at all Not difficult at all Not difficult at all      Impression and Recommendations:    1. Other hyperlipidemia   2. Class 1 obesity with serious comorbidity and body mass index (BMI) of 30.0 to 30.9 in adult, unspecified obesity type   3. Vitamin D deficiency   4. Insulin resistance   5. At risk of diabetes mellitus   6. Gastrocnemius muscle strain, left, initial encounter      - Last OV October 19 2018.  Gastrocnemius Muscle Strain, Left - Reviewed patient's symptoms at length today. - Discussed that if patient is experiencing significant pain, discomfort, and interruption to her lifestyle, further evaluation / follow-up may be indicated.  - Discussed referral to Sports Medicine today and how they treat with nitoglycerin patches etc---> txmnt options beyond what I am familiar with - Ambulatory referral sports med provided today.  See orders. - Encouraged patient to call front desk after a few days to inquire after referral PRN.  - Will continue to monitor.  Insulin Resistance - Last insulin measurement obtained at 8.9. - Discussed that goal insulin should be less than  5. - Education provided today and all questions answered. - Encouraged patient to continue to prudently decrease carb intake  - Will continue to monitor.  Hyperlipidemia - Discussed cholesterol with patient today.  - Lipitor discontinued today due to possible myalgias. - Patient agrees to stick to diet in the meantime until next evaluation.  - Patient will monitor to see whether her muscles ache less. - Cholesterol will be checked again in three months.  - Prudent dietary changes such as low saturated & trans fat diets for hyperlipidemia and low carb diets for hypertriglyceridemia discussed with patient.    -  Encouraged patient to follow AHA guidelines for regular exercise and also engage in weight loss if BMI above 25.   - Educational handouts provided at patient's desire and/ or told to look online at the [facility] website for further information.  - We will continue to monitor and re-check as advised.  BMI Counseling - Body mass index is 30.02 kg/m Explained to patient what BMI refers to, and what it means medically.  Told patient to think about it as a "medical risk stratification measurement" and how increasing BMI is associated with increasing risk/ or worsening state of various diseases such as hypertension, hyperlipidemia, diabetes, premature OA, depression etc.  American Heart Association guidelines for healthy diet, basically Mediterranean diet, and exercise guidelines of 30 minutes 5 days per week or more discussed in detail.  - Encouraged patient to continue to follow up with [facility] for weight loss goals. - Advised patient to stick with the program and continue to work toward healthier habits.  - Health counseling performed.  All questions answered.  Lifestyle & Preventative Health Maintenance - Advised patient to continue working toward exercising to improve overall mental, physical, and emotional health.    - Reviewed the  "spokes of the wheel" of mood and health management.  Stressed the importance of ongoing prudent habits, including regular exercise, appropriate sleep hygiene, healthful dietary habits, and prayer/meditation to relax.  - Encouraged patient to engage in daily physical activity, especially a formal exercise routine.  Recommended that the patient eventually strive for at least 150 minutes of moderate cardiovascular activity per week according to guidelines established by the [facility].   - Healthy dietary habits encouraged, including low-carb, and high amounts of lean protein in diet.   - Patient should also consume adequate amounts of water.  Recommendations - Return for full set of fasting blood work including insulin level in 3 months, with OV DOXY 3 days later.   - As part of my medical decision making, I reviewed the following data within the [facility] History obtained from pt /family, CMA notes reviewed and incorporated if applicable, Labs reviewed, Radiograph/ tests reviewed if applicable and OV notes from prior OV's with me, as well as other specialists she/he has seen since seeing me last, were all reviewed and used in my medical decision making process today.    - Additionally, discussion had with patient regarding our treatment plan, and their biases/concerns about that plan were used in my medical decision making today.    - The patient agreed with the plan and demonstrated an understanding of the instructions.   No barriers to understanding were identified.    - The patient was advised to call back or seek an in-person evaluation if any symptoms worsen or if the condition fails to improve as anticipated.   Return for Full FBW including insulin level in 3 mo, w doxy 3d later.    Orders Placed This Encounter  Procedures   Ambulatory referral to Sports Medicine     Medications Discontinued During This Encounter  Medication Reason   atorvastatin (LIPITOR) 10 MG  tablet Discontinued by provider      I provided 30 minutes of non face-to-face time during this encounter.  Additional time was spent with charting and coordination of care after the actual visit commenced.   Note:  This note was prepared with assistance of Dragon voice recognition software. Occasional wrong-word or sound-a-like substitutions may have occurred due to the inherent limitations of  voice recognition software.  This document serves as a record of services personally performed by Denise Dance, DO. It was created on her behalf by Toni Amend, a trained medical scribe. The creation of this record is based on the scribe's personal observations and the provider's statements to them.   This case required medical decision making of at least moderate complexity. The above documentation has been reviewed to be accurate and was completed by Marjory Sneddon, D.O.       Patient Care Team    Relationship Specialty Notifications Start End  Denise Dance, DO PCP - General Family Medicine  05/13/18   Skeet Latch, MD Attending Physician Cardiology  05/13/18   Garlan Fillers, MD Consulting Physician Family Medicine  05/16/18      -Vitals obtained; medications/ allergies reconciled;  personal medical, social, Sx etc.histories were updated by CMA, reviewed by me and are reflected in chart   Patient Active Problem List   Diagnosis Date Noted   Mood disorder ([place]) 05/13/2018    Priority: High   Adjustment disorder with mixed anxiety and depressed mood 05/13/2018    Priority: High   Hyperlipidemia 02/20/2016    Priority: Medium   Migraine headache 03/21/2014    Priority: Low   Gastrocnemius muscle strain, left, initial encounter 09/14/2019   Insulin resistance 08/11/2019   Vitamin D deficiency 07/27/2019   Class 1 obesity with serious comorbidity and body mass index (BMI) of 30.0 to 30.9 in adult 07/27/2019   Elevated BMI 10/19/2018   Hypertriglyceridemia  07/19/2018   Low HDL (under 40) 07/19/2018   Low libido 07/19/2018   Weight gain due to medication 07/19/2018   Environmental and seasonal allergies 05/13/2018   Chest pain 02/20/2016   Palpitations 02/13/2016   Irregular heart beat 02/13/2016   Encounter for health maintenance examination 01/16/2016   Allergic rhinitis 03/21/2014   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 OFFICE VISIT REQUIRED PRIOR TO ANY FURTHER REFILLS   loratadine (CLARITIN) 10 MG tablet Take 10 mg by mouth daily.   Vitamin D, Ergocalciferol, (DRISDOL) 1.25 MG (50000 UT) CAPS capsule Take 1 capsule (50,000 Units total) by mouth every 7 (seven) days.     Allergies:  Allergies  Allergen Reactions   Ceclor [Cefaclor]    Erythromycin    Iodine    Penicillins      ROS:  See above HPI for pertinent positives and negatives   Objective:   Blood pressure 116/72, height 5' 5"  (1.651 m), weight 180 lb 6.4 oz (81.8 kg), last menstrual period 08/24/2019.  (if some vitals are omitted, this means that patient was UNABLE to obtain them even though they were asked to get them prior to [place] today.  They were asked to call us at their earliest convenience with these once obtained. )  General: A & O * 3; sounds in no acute distress; in usual state of health.  Skin: Pt confirms warm and dry extremities and pink fingertips HEENT: Pt confirms lips non-cyanotic Chest: Patient confirms normal chest excursion and movement Respiratory: speaking in full sentences, no conversational dyspnea; patient confirms no use of accessory muscles Psych: insight appears good, mood- appears full

## 2019-09-21 ENCOUNTER — Other Ambulatory Visit: Payer: Self-pay

## 2019-09-21 ENCOUNTER — Other Ambulatory Visit: Payer: Self-pay | Admitting: Family Medicine

## 2019-09-21 ENCOUNTER — Other Ambulatory Visit (INDEPENDENT_AMBULATORY_CARE_PROVIDER_SITE_OTHER): Payer: Self-pay | Admitting: Family Medicine

## 2019-09-21 ENCOUNTER — Encounter (INDEPENDENT_AMBULATORY_CARE_PROVIDER_SITE_OTHER): Payer: Self-pay | Admitting: Family Medicine

## 2019-09-21 ENCOUNTER — Ambulatory Visit (INDEPENDENT_AMBULATORY_CARE_PROVIDER_SITE_OTHER): Payer: No Typology Code available for payment source | Admitting: Family Medicine

## 2019-09-21 VITALS — BP 114/69 | HR 71 | Temp 98.8°F | Ht 65.0 in | Wt 183.0 lb

## 2019-09-21 DIAGNOSIS — E8881 Metabolic syndrome: Secondary | ICD-10-CM

## 2019-09-21 DIAGNOSIS — E559 Vitamin D deficiency, unspecified: Secondary | ICD-10-CM | POA: Diagnosis not present

## 2019-09-21 DIAGNOSIS — R6882 Decreased libido: Secondary | ICD-10-CM

## 2019-09-21 DIAGNOSIS — R109 Unspecified abdominal pain: Secondary | ICD-10-CM

## 2019-09-21 DIAGNOSIS — E7849 Other hyperlipidemia: Secondary | ICD-10-CM | POA: Diagnosis not present

## 2019-09-21 DIAGNOSIS — Z9189 Other specified personal risk factors, not elsewhere classified: Secondary | ICD-10-CM

## 2019-09-21 DIAGNOSIS — E669 Obesity, unspecified: Secondary | ICD-10-CM

## 2019-09-21 DIAGNOSIS — Z6830 Body mass index (BMI) 30.0-30.9, adult: Secondary | ICD-10-CM

## 2019-09-21 DIAGNOSIS — F39 Unspecified mood [affective] disorder: Secondary | ICD-10-CM

## 2019-09-21 DIAGNOSIS — R635 Abnormal weight gain: Secondary | ICD-10-CM

## 2019-09-21 MED ORDER — VITAMIN D (ERGOCALCIFEROL) 1.25 MG (50000 UNIT) PO CAPS: 50000.0000 [IU] | ORAL_CAPSULE | ORAL | 0 refills | Status: DC

## 2019-09-21 MED ORDER — BUPROPION HCL 75 MG PO TABS: 75.0000 mg | ORAL_TABLET | Freq: Two times a day (BID) | ORAL | 0 refills | Status: DC

## 2019-09-21 MED FILL — buPROPion HCL 75 MG TABS: 75 | 90 days supply | Qty: 180 | Fill #0

## 2019-09-21 MED FILL — VIT D2 1.25 MG (50,000 UNIT: 1.25 MG | 28 days supply | Qty: 4 | Fill #0

## 2019-09-22 ENCOUNTER — Other Ambulatory Visit: Payer: Self-pay

## 2019-09-22 ENCOUNTER — Ambulatory Visit: Payer: No Typology Code available for payment source | Admitting: Family Medicine

## 2019-09-22 ENCOUNTER — Ambulatory Visit: Payer: Self-pay

## 2019-09-22 VITALS — BP 110/78 | Ht 65.0 in | Wt 183.0 lb

## 2019-09-22 DIAGNOSIS — M79662 Pain in left lower leg: Secondary | ICD-10-CM | POA: Diagnosis not present

## 2019-09-22 NOTE — Progress Notes (Signed)
    Subjective:  Denise Strong is a 43 y.o. female who presents to the [facility] today with a chief complaint of recurring left calf pain.   HPI: Pain of left calf Now on third episode of recurring left calf pain.  First episode was in September.  Pain usually on planting of foot as opposed to pushing off.  Pain is located medially in the calf muscle over the gastroc area and does not radiate to the knee or to the ankle.  She denies any significant traumatic injury moment and says that the first episode was just on the placement of her foot during a straight run.  No neurological symptoms, no distal sensation deficits, no swelling or skin changes.  No known and diagnosed prior knee injuries other than "irritation" in the knee years ago for which she received a cortisone shot  Objective:  Physical Exam: BP 110/78   Ht 5\' 5"  (1.651 m)   Wt 183 lb (83 kg)   LMP 08/24/2019   BMI 30.45 kg/m   Gen: NAD, pleasant and conversing comfortably CV: RRR with no murmurs appreciated Pulm: NWOB, CTAB with no crackles, wheezes, or rhonchi GI: Normal bowel sounds present. Soft, Nontender, Nondistended. MSK: no edema, cyanosis, or clubbing noted, walks without a limp although does exhibit mild abductor weakness.  No indication of pathology to ankle, knee exam is negative with negative anterior posterior drawer, Apley's, valgus varus laxity.  No asymmetry in size or firmness to calves, no indication of DVT, point tenderness in same position from prior 2 episodes to medial posterior gastroc.  No tenderness to the tibia indicating stress fracture Skin: warm, dry Neuro: grossly normal, moves all extremities Psych: Normal affect and thought content  No results found for this or any previous visit (from the past 72 hour(s)).   Assessment/Plan:  Pain of left calf Now on third episode of recurring left calf pain.  First episode was in September.  Pain usually on planting of foot as opposed to pushing off.  Pain is  located medially in the calf muscle over the gastroc area and does not radiate to the knee or to the ankle.  She denies any significant traumatic injury moment and says that the first episode was just on the placement of her foot during a straight run.  Ultrasound of left calf confirms minor inflammation over left gastroc consistent with patient's claim location of pain.  Achilles insertion is normal on imaging and patient has negative knee exam with negative anterior posterior drawer, valgus varus laxity, Apley's.  Prescribing calf rehab and abductor exercises along with reduction of sports activity for the next 3 to 4 weeks.  May come back as needed.  If repeats can consider Nitropatch   Sherene Sires, DO FAMILY MEDICINE RESIDENT - PGY3 09/22/2019 9:31 AM

## 2019-09-22 NOTE — Patient Instructions (Signed)
I think you have a recurrent gastrocnemius muscle or calf strain.  I am giving you a rehabilitation program to progress over the next 4 weeks.  I would hold off on any heavy duty walking or running or jumping until you have completed the program.  To prevent this in the future, I would recommend you do some of   these exercises during your softball season.  I would recommend doing them 2-3 times a week.  This will keep that muscle limber and healthy.  I will also give you some hip exercises.  I do not think your IT band needs stretching as much as you need to increase the strength of your gluteus medius muscles.  I would do the exercises on both sides.  If this does not improve, please return to clinic.  It was nice to meet you!  Have a happy and safe holiday

## 2019-09-22 NOTE — Assessment & Plan Note (Signed)
Now on third episode of recurring left calf pain.  First episode was in September.  Pain usually on planting of foot as opposed to pushing off.  Pain is located medially in the calf muscle over the gastroc area and does not radiate to the knee or to the ankle.  She denies any significant traumatic injury moment and says that the first episode was just on the placement of her foot during a straight run.  Ultrasound of left calf confirms minor inflammation over left gastroc consistent with patient's claim location of pain.  Achilles insertion is normal on imaging and patient has negative knee exam with negative anterior posterior drawer, valgus varus laxity, Apley's.  Prescribing calf rehab and abductor exercises along with reduction of sports activity for the next 3 to 4 weeks.  May come back as needed.  If repeats can consider Nitropatch

## 2019-09-25 NOTE — Progress Notes (Signed)
[place] Attending Note: I have seen and examined this patient. I have discussed this patient with the resident and reviewed the assessment and plan as documented above. I agree with the resident's findings and plan. Ultrasound shows 2 areas of change in the medial gastrocnemius muscle.  This area is right over the tender spot.  I think she has had mild tearing/strain of this muscle.  Unclear why she seems to have this recurrently.  We discussed activities and I would avoid running jumping lunging for the next 3 to 4 weeks.  Placed her on gastrocnemius rehab program.  If she is totally resolved, she does not need to follow-up.  If she does not resolve, she will follow up in 3 to 4 weeks.  Also recommend she do these exercises 2-4 times a week during her softball season as she is evidently prone to gastrocnemius strain.

## 2019-09-26 NOTE — Progress Notes (Signed)
Office: [phone]  /  Fax: [phone]   HPI:  Chief Complaint: OBESITY Denise Strong is here to discuss her progress with her obesity treatment plan. She is on the Category 3 plan and states she is following her eating plan approximately 35 % of the time. She states she is exercising 0 minutes 0 times per week.  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 Her wife also comes here to our clinic. She did not adhere to her diet well over the Thanksgiving holiday. She is not exercising due to left leg pain.  Today's visit was # 6  Starting weight: 186 lbs Starting date: 06/14/2019 Today's weight : 183 lbs Today's date: 09/21/2019 Total lbs lost to date: 3 Total lbs lost since last in-office visit: 0  Insulin Resistance Denise Strong has a diagnosis of insulin resistance. Her last insulin level was 23.3 on 06/14/2019. She denies polyphagia.  At risk for diabetes Denise Strong is at higher than average risk for developing diabetes due to her obesity and insulin resistance.   Vitamin D Deficiency Denise Strong has a diagnosis of vitamin D deficiency. She is tolerating prescription Vit D supplement well.  Hyperlipidemia Denise Strong has a diagnosis of  hyperlipidemia. She had irregular use of Lipitor previously due to myalgia. Labs were reviewed today.   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 She will be seeing Dr. Nori Riis soon.  ASSESSMENT AND PLAN:  Insulin resistance  Vitamin D deficiency - Plan: Vitamin D, Ergocalciferol, (DRISDOL) 1.25 MG (50000 UT) CAPS capsule  Other hyperlipidemia  Gastric pain, left  At risk for diabetes mellitus  Class 1 obesity with serious comorbidity and body mass index (BMI) of 30.0 to 30.9 in adult, unspecified obesity type  PLAN:  Insulin Resistance Denise Strong will continue to work on weight loss, exercise, and decreasing simple carbohydrates to help decrease the risk of diabetes. We will recheck labs at her next visit.  Denise Strong agrees to follow up with [place] as directed to closely monitor her progress.  Diabetes risk counseling (~15 min) Denise Strong is a 43 y.o. female and has risk factors for diabetes including obesity and insulin resistance. We discussed intensive lifestyle modifications today with an emphasis on weight loss as well as increasing exercise and decreasing simple carbohydrates in her diet.  Vitamin D Deficiency Low vitamin D level contributes to fatigue and are associated with obesity, breast, and colon cancer. Denise Strong agrees to continue taking prescription Vit D 50,000 IU every week #4 and we will refill for 1 month. She will follow up for routine testing of vitamin D, at least 2-3 times per year to avoid over-replacement. Denise Strong agrees to follow up with our clinic in 2 weeks.  Hyperlipidemia Intensive lifestyle modifications as the first line treatment for hyperlipidemia. We discussed many lifestyle modifications today and Denise Strong will continue to work on diet, exercise and weight loss efforts. We will recheck labs at her next visit. She is to consider lipid clinic. We will continue to monitor.  Left Gastric Pain We will continue to follow up.  Obesity Denise Strong is currently in the action stage of change. As such, her goal is to continue with weight loss efforts. She has agreed to follow the Category 3 plan.  Denise Strong has been instructed to work up to a goal of 150 minutes of combined cardio and strengthening exercise per week for weight loss and overall health benefits.  We discussed the following Behavioral Modification Strategies today: decreasing  simple carbohydrates, increase H20 intake, keeping healthy foods in the home, work on meal planning and easy cooking plans, dealing with family or coworker sabotage and holiday eating strategies   Denise Strong has agreed to follow up with our clinic in 2 weeks. She was informed of the importance of frequent follow up visits to maximize her success with intensive lifestyle  modifications for her multiple health conditions.  ALLERGIES: Allergies  Allergen Reactions  . Ceclor [Cefaclor]   . Erythromycin   . Iodine   . Penicillins    MEDICATIONS: Current Outpatient Medications on File Prior to Visit  Medication Sig Dispense Refill  . loratadine (CLARITIN) 10 MG tablet Take 10 mg by mouth daily.     No current facility-administered medications on file prior to visit.   PAST MEDICAL HISTORY: Past Medical History:  Diagnosis Date  . Anxiety   . Chest pain 02/20/2016  . Constipation   . Elevated fasting blood sugar   . Hyperlipidemia 02/20/2016  . Kidney problem   . Multiple food allergies   . Palpitations 02/20/2016  . Renal disorder    PAST SURGICAL HISTORY: Past Surgical History:  Procedure Laterality Date  . APPENDECTOMY    . CHOLECYSTECTOMY     SOCIAL HISTORY: Social History   Tobacco Use  . Smoking status: Former Smoker    Quit date: 02/20/1995    Years since quitting: 24.6  . Smokeless tobacco: Never Used  . Tobacco comment: social; in college  Substance Use Topics  . Alcohol use: No    Alcohol/week: 0.0 standard drinks  . Drug use: No   FAMILY HISTORY: Family History  Problem Relation Age of Onset  . Diabetes Mother   . Hypertension Father   . Stroke Father   . Heart disease Father   . Sleep apnea Father   . Diabetes Brother   . Diabetes Maternal Grandmother   . AAA (abdominal aortic aneurysm) Maternal Grandfather   . Dementia Paternal Grandfather    ROS: Review of Systems  Constitutional: Negative for weight loss.  Cardiovascular:       + Left leg pain  Musculoskeletal: Positive for myalgias.  Endo/Heme/Allergies:       Negative polyphagia   PHYSICAL EXAM: Blood pressure 114/69, pulse 71, temperature 98.8 F (37.1 C), temperature source Oral, height 5\' 5"  (1.651 m), weight 183 lb (83 kg), last menstrual period 08/24/2019, SpO2 95 %. Body mass index is 30.45 kg/m.   Physical Exam Vitals reviewed.    Constitutional:      Appearance: Normal appearance. She is obese.  Cardiovascular:     Rate and Rhythm: Normal rate.     Pulses: Normal pulses.  Pulmonary:     Effort: Pulmonary effort is normal.     Breath sounds: Normal breath sounds.  Musculoskeletal:        General: Normal range of motion.  Skin:    General: Skin is warm and dry.  Neurological:     Mental Status: She is alert and oriented to person, place, and time.  Psychiatric:        Mood and Affect: Mood normal.        Behavior: Behavior normal.    RECENT LABS AND TESTS: BMET    Component Value Date/Time   NA 143 06/14/2019 1055   K 4.4 06/14/2019 1055   CL 106 06/14/2019 1055   CO2 21 06/14/2019 1055   GLUCOSE 90 06/14/2019 1055   GLUCOSE 85 02/20/2016 1143   BUN 17 06/14/2019 1055  CREATININE 0.52 (L) 06/14/2019 1055   CREATININE 0.56 02/20/2016 1143   CALCIUM 8.9 06/14/2019 1055   GFRNONAA 118 06/14/2019 1055   GFRAA 136 06/14/2019 1055   Lab Results  Component Value Date   HGBA1C 5.5 06/14/2019   HGBA1C 5.5 06/23/2018   Lab Results  Component Value Date   INSULIN 8.9 06/14/2019   CBC    Component Value Date/Time   WBC 6.2 06/14/2019 1055   RBC 4.62 06/14/2019 1055   HGB 13.9 06/14/2019 1055   HCT 40.8 06/14/2019 1055   PLT 307 06/23/2018 0901   MCV 88 06/14/2019 1055   MCH 30.1 06/14/2019 1055   MCHC 34.1 06/14/2019 1055   RDW 12.8 06/14/2019 1055   LYMPHSABS 2.1 06/14/2019 1055   EOSABS 0.1 06/14/2019 1055   BASOSABS 0.1 06/14/2019 1055   Lipid Panel     Component Value Date/Time   CHOL 208 (H) 06/14/2019 1055   TRIG 281 (H) 06/14/2019 1055   HDL 40 06/14/2019 1055   CHOLHDL 4.5 (H) 06/23/2018 0901   LDLCALC 119 (H) 06/14/2019 1055   Hepatic Function Panel     Component Value Date/Time   PROT 6.9 06/14/2019 1055   ALBUMIN 4.4 06/14/2019 1055   AST 14 06/14/2019 1055   ALT 17 06/14/2019 1055   ALKPHOS 65 06/14/2019 1055   BILITOT 0.3 06/14/2019 1055      Component Value  Date/Time   TSH 0.886 06/14/2019 1055   TSH 1.410 06/23/2018 0901   TSH 1.31 02/20/2016 1143   I, Trixie Dredge, am acting as transcriptionist for Briscoe Deutscher, DO  I have reviewed the above documentation for accuracy and completeness, and I agree with the above. Briscoe Deutscher, DO

## 2019-09-28 ENCOUNTER — Encounter (INDEPENDENT_AMBULATORY_CARE_PROVIDER_SITE_OTHER): Payer: Self-pay | Admitting: Family Medicine

## 2019-09-28 LAB — HM MAMMOGRAPHY

## 2019-09-28 MED FILL — TERCONAZOLE 0.4% VAG CREAM: 0.4 | 7 days supply | Qty: 45 | Fill #0

## 2019-09-28 MED FILL — FLUCONAZOLE 150 MG TABLET: 150 | 2 days supply | Qty: 2 | Fill #0

## 2019-11-09 ENCOUNTER — Ambulatory Visit (INDEPENDENT_AMBULATORY_CARE_PROVIDER_SITE_OTHER): Payer: No Typology Code available for payment source | Admitting: Family Medicine

## 2019-11-29 ENCOUNTER — Other Ambulatory Visit: Payer: Self-pay

## 2019-11-29 ENCOUNTER — Ambulatory Visit (INDEPENDENT_AMBULATORY_CARE_PROVIDER_SITE_OTHER): Payer: No Typology Code available for payment source | Admitting: Family Medicine

## 2019-11-29 ENCOUNTER — Encounter (INDEPENDENT_AMBULATORY_CARE_PROVIDER_SITE_OTHER): Payer: Self-pay | Admitting: Family Medicine

## 2019-11-29 VITALS — BP 107/67 | HR 73 | Temp 98.3°F | Ht 65.0 in | Wt 185.0 lb

## 2019-11-29 DIAGNOSIS — Z9189 Other specified personal risk factors, not elsewhere classified: Secondary | ICD-10-CM | POA: Diagnosis not present

## 2019-11-29 DIAGNOSIS — E66811 Obesity, class 1: Secondary | ICD-10-CM

## 2019-11-29 DIAGNOSIS — E669 Obesity, unspecified: Secondary | ICD-10-CM

## 2019-11-29 DIAGNOSIS — E7849 Other hyperlipidemia: Secondary | ICD-10-CM

## 2019-11-29 DIAGNOSIS — E559 Vitamin D deficiency, unspecified: Secondary | ICD-10-CM

## 2019-11-29 DIAGNOSIS — Z6830 Body mass index (BMI) 30.0-30.9, adult: Secondary | ICD-10-CM

## 2019-12-04 NOTE — Progress Notes (Signed)
Chief Complaint:   OBESITY Denise Strong is here to discuss her progress with her obesity treatment plan along with follow-up of her obesity related diagnoses. Denise Strong is on the Category 3 Plan and states she is following her eating plan approximately 0% of the time. [name] states she is walking 2 to 3 miles, 7 times per week.  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 She has struggled to maintain adherence to the meal plan. Denise Strong is ready to recommit.  Subjective:   Vitamin D deficiency [name]'s Vitamin D level was 23.3 on 06/14/2019. She is currently taking vit D. [name] admits fatigue and denies nausea, vomiting or muscle weakness.  Other hyperlipidemia [name] has LDL of 119, HDL of 40 and triglycerides of 281 (06/14/19). She is not on statin. Her 10 year ASCVD risk is 1.2%.  Lab Results  Component Value Date   CHOL 208 (H) 06/14/2019   HDL 40 06/14/2019   LDLCALC 119 (H) 06/14/2019   TRIG 281 (H) 06/14/2019   CHOLHDL 4.5 (H) 06/23/2018   Lab Results  Component Value Date   ALT 17 06/14/2019   AST 14 06/14/2019   ALKPHOS 65 06/14/2019   BILITOT 0.3 06/14/2019   At risk for osteoporosis Denise Strong is at higher risk of osteopenia and osteoporosis due to Vitamin D deficiency.   Assessment/Plan:   Vitamin D deficiency  Low Vitamin D level contributes to fatigue and are associated with obesity, breast, and colon cancer. Denise Strong agrees to continue to take prescription Vitamin D @50 ,000 IU every week #4 with no refills and she will follow-up for routine testing of Vitamin D, at least 2-3 times per year to avoid over-replacement.  Other hyperlipidemia Cardiovascular risk  and specific lipid/LDL goals reviewed.  We discussed several lifestyle modifications today and [name] will continue to work on diet, exercise and weight loss efforts. We will repeat labs at the next appointment. Orders and follow up as documented in patient record.   Counseling Intensive lifestyle modifications are the first line treatment for this issue. . Dietary changes: Increase soluble fiber. Decrease simple carbohydrates. . Exercise changes: Moderate to vigorous-intensity aerobic activity 150 minutes per week if tolerated. . Lipid-lowering medications: see documented in medical record.  At risk for osteoporosis Denise Strong was given approximately 15 minutes of osteoporosis prevention counseling today. Denise Strong is at risk for osteopenia and osteoporosis due to her Vitamin D deficiency. She was encouraged to take her Vitamin D and follow her higher calcium diet and increase strengthening exercise to help strengthen her bones and decrease her risk of osteopenia and osteoporosis.  Repetitive spaced learning was employed today to elicit superior memory formation and behavioral change.  Class 1 obesity with serious comorbidity and body mass index (BMI) of 30.0 to 30.9 in adult, unspecified obesity type Denise Strong is currently in the action stage of change. As such, her goal is to continue with weight loss efforts. She has agreed to the Category 3 Plan.   Behavioral modification strategies: increasing lean protein intake, increasing vegetables, meal planning and cooking strategies, keeping healthy foods in the home and planning for success.  Denise Strong has agreed to follow-up with our clinic in 2 weeks.  She was informed of the importance of frequent follow-up visits to maximize her success with intensive lifestyle modifications for her multiple health conditions.   Objective:   Blood pressure 107/67, pulse 73, temperature 98.3 F (36.8 C), temperature source Oral, height 5\' 5"  (1.651 m), weight 185 lb (83.9 kg),  last menstrual period 11/12/2019, SpO2 95 %. Body mass index is 30.79 kg/m.  General: Cooperative, alert, well developed, in no acute distress. HEENT: Conjunctivae and lids unremarkable. Cardiovascular: Regular rhythm.  Lungs: Normal work of breathing. Neurologic: No focal deficits.   Lab Results  Component Value Date   CREATININE 0.52 (L) 06/14/2019   BUN 17 06/14/2019   NA 143 06/14/2019   K 4.4 06/14/2019   CL 106 06/14/2019   CO2 21 06/14/2019   Lab Results  Component Value Date   ALT 17 06/14/2019   AST 14 06/14/2019   ALKPHOS 65 06/14/2019   BILITOT 0.3 06/14/2019   Lab Results  Component Value Date   HGBA1C 5.5 06/14/2019   HGBA1C 5.5 06/23/2018   Lab Results  Component Value Date   INSULIN 8.9 06/14/2019   Lab Results  Component Value Date   TSH 0.886 06/14/2019   Lab Results  Component Value Date   CHOL 208 (H) 06/14/2019   HDL 40 06/14/2019   LDLCALC 119 (H) 06/14/2019   TRIG 281 (H) 06/14/2019   CHOLHDL 4.5 (H) 06/23/2018   Lab Results  Component Value Date   WBC 6.2 06/14/2019   HGB 13.9 06/14/2019   HCT 40.8 06/14/2019   MCV 88 06/14/2019   PLT 307 06/23/2018   No results found for: IRON, TIBC, FERRITIN   Ref. Range 06/14/2019 10:55  Vitamin D, 25-Hydroxy Latest Ref Range: 30.0 - 100.0 ng/mL 23.3 (L)    Attestation Statements:   Reviewed by clinician on day of visit: allergies, medications, problem list, medical history, surgical history, family history, social history, and previous encounter notes.   I, Doreene Nest, am acting as Location manager for Eber Jones, Md.  I have reviewed the above documentation for accuracy and completeness, and I agree with the above. - Ilene Qua, MD

## 2019-12-07 MED ORDER — VITAMIN D (ERGOCALCIFEROL) 1.25 MG (50000 UNIT) PO CAPS: 50000.0000 [IU] | ORAL_CAPSULE | ORAL | 0 refills | Status: DC

## 2019-12-07 MED FILL — VIT D2 1.25 MG (50,000 UNIT: 1.25 MG | 28 days supply | Qty: 4 | Fill #0

## 2019-12-18 ENCOUNTER — Ambulatory Visit (INDEPENDENT_AMBULATORY_CARE_PROVIDER_SITE_OTHER): Payer: No Typology Code available for payment source | Admitting: Family Medicine

## 2019-12-18 ENCOUNTER — Encounter (INDEPENDENT_AMBULATORY_CARE_PROVIDER_SITE_OTHER): Payer: Self-pay | Admitting: Family Medicine

## 2019-12-18 ENCOUNTER — Other Ambulatory Visit: Payer: Self-pay

## 2019-12-18 VITALS — BP 110/66 | HR 71 | Temp 97.8°F | Ht 65.0 in | Wt 186.0 lb

## 2019-12-18 DIAGNOSIS — E8881 Metabolic syndrome: Secondary | ICD-10-CM | POA: Diagnosis not present

## 2019-12-18 DIAGNOSIS — E7849 Other hyperlipidemia: Secondary | ICD-10-CM | POA: Diagnosis not present

## 2019-12-18 DIAGNOSIS — F3289 Other specified depressive episodes: Secondary | ICD-10-CM | POA: Diagnosis not present

## 2019-12-18 DIAGNOSIS — Z9189 Other specified personal risk factors, not elsewhere classified: Secondary | ICD-10-CM | POA: Diagnosis not present

## 2019-12-18 DIAGNOSIS — E559 Vitamin D deficiency, unspecified: Secondary | ICD-10-CM

## 2019-12-18 DIAGNOSIS — Z6831 Body mass index (BMI) 31.0-31.9, adult: Secondary | ICD-10-CM

## 2019-12-18 DIAGNOSIS — E669 Obesity, unspecified: Secondary | ICD-10-CM

## 2019-12-18 NOTE — Progress Notes (Signed)
Chief Complaint:   OBESITY Denise Strong is here to discuss her progress with her obesity treatment plan along with follow-up of her obesity related diagnoses. Denise Strong is on the Category 3 Plan and states she is following her eating plan approximately 25% of the time. Denise Strong states she is exercising for 0 minutes 0 times per week.  Today's visit was #: 8 Starting weight: 186 lbs Starting date: 06/14/2019 Today's weight: 186 lbs Today's date: 12/18/2019 Total lbs lost to date: 0 Total lbs lost since last in-office visit: 0  Interim History: Denise Strong says that she struggles with dinner.  She provided the following food recall for a workday.  Her work day starts at [employer] am and ends at 7 pm.  Breakfast:  Yogurt, bread, peanut butter, cheese (9-10 am) Lunch:  Salad with meat (2-3 pm) or [employer]. Dinner:  After kids go to bed (after 8 pm)  Subjective:   1. Insulin resistance Denise Strong has a diagnosis of insulin resistance based on her elevated fasting insulin level >5. She continues to work on diet and exercise to decrease her risk of diabetes.  Lab Results  Component Value Date   INSULIN 8.9 06/14/2019   Lab Results  Component Value Date   HGBA1C 5.5 06/14/2019   2. Vitamin D deficiency Denise Strong Vitamin D level was 23.3 on 06/14/2019. She is currently taking vit D. She denies nausea, vomiting or muscle weakness.  3. Other hyperlipidemia Denise Strong has hyperlipidemia and has been trying to improve her cholesterol levels with intensive lifestyle modification including a low saturated fat diet, exercise and weight loss. She denies any chest pain, claudication or myalgias.  Lab Results  Component Value Date   ALT 17 06/14/2019   AST 14 06/14/2019   ALKPHOS 65 06/14/2019   BILITOT 0.3 06/14/2019   Lab Results  Component Value Date   CHOL 208 (H) 06/14/2019   HDL 40 06/14/2019   LDLCALC 119 (H) 06/14/2019   TRIG 281 (H) 06/14/2019   CHOLHDL 4.5 (H) 06/23/2018   4. Other depression, with  emotional eating Denise Strong is struggling with emotional eating and using food for comfort to the extent that it is negatively impacting her health. She has been working on behavior modification techniques to help reduce her emotional eating and has been unsuccessful. She shows no sign of suicidal or homicidal ideations.  She is taking Wellbutrin.  5. At risk for heart disease Denise Strong is at a higher than average risk for cardiovascular disease due to obesity. Reviewed: no chest pain on exertion, no dyspnea on exertion, and no swelling of ankles.  Assessment/Plan:   1. Insulin resistance Denise Strong will continue to work on weight loss, exercise, and decreasing simple carbohydrates to help decrease the risk of diabetes. Denise Strong agreed to follow-up with [place] as directed to closely monitor her progress.  2. Vitamin D deficiency Low Vitamin D level contributes to fatigue and are associated with obesity, breast, and colon cancer. She agrees to continue to take prescription Vitamin D @50 ,000 IU every week and will follow-up for routine testing of Vitamin D, at least 2-3 times per year to avoid over-replacement.  3. Other hyperlipidemia Cardiovascular risk and specific lipid/LDL goals reviewed.  We discussed several lifestyle modifications today and Denise Strong will continue to work on diet, exercise and weight loss efforts. Orders and follow up as documented in patient record.   Counseling Intensive lifestyle modifications are the first line treatment for this issue. . Dietary changes: Increase soluble fiber. Decrease simple carbohydrates. Marland Kitchen  Exercise changes: Moderate to vigorous-intensity aerobic activity 150 minutes per week if tolerated. . Lipid-lowering medications: see documented in medical record.  4. Other depression, with emotional eating Behavior modification techniques were discussed today to help Denise Strong deal with her emotional/non-hunger eating behaviors.  Orders and follow up as documented in patient record.     5. At risk for heart disease Denise Strong was given approximately 15 minutes of coronary artery disease prevention counseling today. She is 44 y.o. female and has risk factors for heart disease including obesity. We discussed intensive lifestyle modifications today with an emphasis on specific weight loss instructions and strategies.   Repetitive spaced learning was employed today to elicit superior memory formation and behavioral change.  6. Class 1 obesity with serious comorbidity and body mass index (BMI) of 31.0 to 31.9 in adult, unspecified obesity type Denise Strong is currently in the action stage of change. As such, her goal is to continue with weight loss efforts. She has agreed to the Category 3 Plan.   Exercise goals: All adults should avoid inactivity. Some physical activity is better than none, and adults who participate in any amount of physical activity gain some health benefits.  Behavioral modification strategies: increasing lean protein intake and increasing water intake.  She will go over her schedule and plan meals.  Consider taking dinner to work.  Denise Strong has agreed to follow-up with our clinic in 2 weeks. She was informed of the importance of frequent follow-up visits to maximize her success with intensive lifestyle modifications for her multiple health conditions.   Objective:   Blood pressure 110/66, pulse 71, temperature 97.8 F (36.6 C), temperature source Oral, height 5\' 5"  (1.651 m), weight 186 lb (84.4 kg), last menstrual period 12/17/2019, SpO2 97 %. Body mass index is 30.95 kg/m.  General: Cooperative, alert, well developed, in no acute distress. HEENT: Conjunctivae and lids unremarkable. Cardiovascular: Regular rhythm.  Lungs: Normal work of breathing. Neurologic: No focal deficits.   Lab Results  Component Value Date   CREATININE 0.52 (L) 06/14/2019   BUN 17 06/14/2019   NA 143 06/14/2019   K 4.4 06/14/2019   CL 106 06/14/2019   CO2 21 06/14/2019   Lab  Results  Component Value Date   ALT 17 06/14/2019   AST 14 06/14/2019   ALKPHOS 65 06/14/2019   BILITOT 0.3 06/14/2019   Lab Results  Component Value Date   HGBA1C 5.5 06/14/2019   HGBA1C 5.5 06/23/2018   Lab Results  Component Value Date   INSULIN 8.9 06/14/2019   Lab Results  Component Value Date   TSH 0.886 06/14/2019   Lab Results  Component Value Date   CHOL 208 (H) 06/14/2019   HDL 40 06/14/2019   LDLCALC 119 (H) 06/14/2019   TRIG 281 (H) 06/14/2019   CHOLHDL 4.5 (H) 06/23/2018   Lab Results  Component Value Date   WBC 6.2 06/14/2019   HGB 13.9 06/14/2019   HCT 40.8 06/14/2019   MCV 88 06/14/2019   PLT 307 06/23/2018   Attestation Statements:   Reviewed by clinician on day of visit: allergies, medications, problem list, medical history, surgical history, family history, social history, and previous encounter notes.  I, Water quality scientist, CMA, am acting as Location manager for [employer], DO.  I have reviewed the above documentation for accuracy and completeness, and I agree with the above. Briscoe Deutscher, DO

## 2020-01-09 ENCOUNTER — Telehealth: Payer: Self-pay | Admitting: Family Medicine

## 2020-01-09 ENCOUNTER — Other Ambulatory Visit: Payer: Self-pay | Admitting: Family Medicine

## 2020-01-09 DIAGNOSIS — R635 Abnormal weight gain: Secondary | ICD-10-CM

## 2020-01-09 DIAGNOSIS — R6882 Decreased libido: Secondary | ICD-10-CM

## 2020-01-09 DIAGNOSIS — F39 Unspecified mood [affective] disorder: Secondary | ICD-10-CM

## 2020-01-09 MED FILL — buPROPion HCL 75 MG TABS: 75 | 30 days supply | Qty: 60 | Fill #0

## 2020-02-16 ENCOUNTER — Other Ambulatory Visit: Payer: Self-pay | Admitting: Family Medicine

## 2020-02-16 DIAGNOSIS — R635 Abnormal weight gain: Secondary | ICD-10-CM

## 2020-02-16 DIAGNOSIS — F39 Unspecified mood [affective] disorder: Secondary | ICD-10-CM

## 2020-02-16 DIAGNOSIS — R6882 Decreased libido: Secondary | ICD-10-CM

## 2020-02-16 MED ORDER — BUPROPION HCL 75 MG PO TABS: 75.0000 mg | ORAL_TABLET | Freq: Two times a day (BID) | ORAL | 0 refills | Status: DC

## 2020-02-16 MED FILL — buPROPion HCL 75 MG TABS: 75 | 30 days supply | Qty: 60 | Fill #0

## 2020-03-21 ENCOUNTER — Other Ambulatory Visit: Payer: Self-pay | Admitting: Physician Assistant

## 2020-03-21 DIAGNOSIS — R635 Abnormal weight gain: Secondary | ICD-10-CM

## 2020-03-21 DIAGNOSIS — F39 Unspecified mood [affective] disorder: Secondary | ICD-10-CM

## 2020-03-21 DIAGNOSIS — R6882 Decreased libido: Secondary | ICD-10-CM

## 2020-03-21 MED ORDER — BUPROPION HCL 75 MG PO TABS: 75.0000 mg | ORAL_TABLET | Freq: Two times a day (BID) | ORAL | 0 refills | Status: DC

## 2020-04-16 ENCOUNTER — Encounter: Payer: Self-pay | Admitting: Physician Assistant

## 2020-04-16 ENCOUNTER — Ambulatory Visit (INDEPENDENT_AMBULATORY_CARE_PROVIDER_SITE_OTHER): Payer: No Typology Code available for payment source | Admitting: Physician Assistant

## 2020-04-16 ENCOUNTER — Other Ambulatory Visit: Payer: Self-pay

## 2020-04-16 VITALS — BP 123/74 | HR 81 | Temp 98.2°F | Ht 65.0 in | Wt 192.0 lb

## 2020-04-16 DIAGNOSIS — Z1159 Encounter for screening for other viral diseases: Secondary | ICD-10-CM

## 2020-04-16 DIAGNOSIS — E559 Vitamin D deficiency, unspecified: Secondary | ICD-10-CM

## 2020-04-16 DIAGNOSIS — Z0000 Encounter for general adult medical examination without abnormal findings: Secondary | ICD-10-CM | POA: Diagnosis not present

## 2020-04-16 DIAGNOSIS — Z114 Encounter for screening for human immunodeficiency virus [HIV]: Secondary | ICD-10-CM

## 2020-04-16 DIAGNOSIS — Z23 Encounter for immunization: Secondary | ICD-10-CM | POA: Diagnosis not present

## 2020-04-16 DIAGNOSIS — Z9189 Other specified personal risk factors, not elsewhere classified: Secondary | ICD-10-CM

## 2020-04-16 DIAGNOSIS — E8881 Metabolic syndrome: Secondary | ICD-10-CM

## 2020-04-16 DIAGNOSIS — H9190 Unspecified hearing loss, unspecified ear: Secondary | ICD-10-CM

## 2020-04-16 DIAGNOSIS — E785 Hyperlipidemia, unspecified: Secondary | ICD-10-CM

## 2020-04-16 DIAGNOSIS — F419 Anxiety disorder, unspecified: Secondary | ICD-10-CM

## 2020-04-16 DIAGNOSIS — E7849 Other hyperlipidemia: Secondary | ICD-10-CM

## 2020-04-16 MED ORDER — HYDROXYZINE HCL 50 MG PO TABS: 50.0000 mg | ORAL_TABLET | Freq: Three times a day (TID) | ORAL | 1 refills | Status: DC | PRN

## 2020-04-16 MED FILL — hydrOXYzine HCL 50 MG TABS: 50 | 10 days supply | Qty: 30 | Fill #0

## 2020-04-16 NOTE — Patient Instructions (Signed)

## 2020-04-16 NOTE — Progress Notes (Signed)
Female Physical   Impression and Recommendations:    1. Healthcare maintenance   2. Need for Tdap vaccination   3. Screening for HIV (human immunodeficiency virus)   4. Encounter for hepatitis C virus screening test for high risk patient   5. Hyperlipidemia, unspecified hyperlipidemia type   6. Encounter for health maintenance examination   7. Familial combined hyperlipidemia   8. Vitamin D deficiency   9. Insulin resistance   10. Anxiety   11. Hearing loss, unspecified hearing loss type, unspecified laterality      1) Anticipatory Guidance: Discussed skin CA prevention and sunscreen when outside along with skin surveillance; eating a balanced and modest diet; physical activity at least 25 minutes per day or minimum of 150 min/ week moderate to intense activity.  2) Immunizations / Screenings / Labs:   All immunizations are up-to-date per recommendations or will be updated today if pt allows.    - Patient understands with dental and vision screens they will schedule independently.  - Will obtain CBC, CMP, HgA1c, Lipid panel, TSH and vit D when fasting -UTD on mammogram: 09/28/19, normal -Placed orders for Tdap, hep C and HIV screenings.  3) Weight:  BMI meaning discussed with patient.  Discussed goal to improve diet habits to improve overall feelings of well being and objective health data. Improve nutrient density of diet through increasing intake of fruits and vegetables and decreasing saturated fats, white flour products and refined sugars.  4) Healthcare maintenance: -Continue current medication regimen. Started hydroxyzine 50 mg to use as needed for severe anxiety. -Placed referral to audiology for hearing loss evaluation. -Follow heart healthy diet and stay as active as possible. -Stay well hydrated, at least 64 fl oz -Follow-up in 4 months for mood management and return for FBW in 1 to 3 weeks.  Meds ordered this encounter  Medications   hydrOXYzine (ATARAX/VISTARIL)  50 MG tablet    Sig: Take 1 tablet (50 mg total) by mouth every 8 (eight) hours as needed for anxiety.    Dispense:  30 tablet    Refill:  1    Order Specific Question:   Supervising Provider    Answer:   Beatrice Lecher D [[id number]]    Orders Placed This Encounter  Procedures   HIV Antibody (routine testing w rflx)   CBC with Differential/Platelet   Comprehensive metabolic panel   Hemoglobin A1c   Lipid panel   TSH   VITAMIN D 25 Hydroxy (Vit-D Deficiency, Fractures)   Hepatitis C antibody   Ambulatory referral to Audiology     Return in about 4 months (around 08/17/2020) for Mood; lab visit for FBW in 1-3 weeks.     Gross side effects, risk and benefits, and alternatives of medications discussed with patient.  Patient is aware that all medications have potential side effects and we are unable to predict every side effect or drug-drug interaction that may occur.  Expresses verbal understanding and consents to current therapy plan and treatment regimen.  F-up preventative CPE in 1 year- this is in addition to any chronic care visits.    Please see orders placed and AVS handed out to patient at the end of our visit for further patient instructions/ counseling done pertaining to today's office visit.  Note:  This note was prepared with assistance of Dragon voice recognition software. Occasional wrong-word or sound-a-like substitutions may have occurred due to the inherent limitations of voice recognition software.     Subjective:     CPE  HPI: Denise Strong is a 44 y.o. female who presents to [place] at [facility] today a yearly health maintenance exam.   Health Maintenance Summary  - Reviewed and updated, unless pt declines services.  Tobacco History Reviewed:  Y, former smoker CT scan for screening lung CA: N/A Alcohol and/or drug use: No concerns; no use Dental Home: Yes Eye exams: Yes Dermatology home: No, no concerns  Female Health:   PAP Smear - last known results: Followed by Ob-Gyn- Dr. Paula Compton, requesting most recent pap STD concerns: none LMP: 04/13/20 Lumps or breast concerns:  none Breast Cancer Family History:  No  Additional concerns beyond health maintenance issues:  Chronic Hearing loss, especially from left ear. Reports sometimes has difficulty with understanding what people are saying.  States Wellbutrin helps to control her mood, but feels like sometimes she needs something in addition to help such as when she is having a busy and stressful day at work.   Immunization History  Administered Date(s) Administered   Influenza-Unspecified 07/12/2013   Tdap 12/16/2005     Health Maintenance  Topic Date Due   Hepatitis C Screening  Never done   COVID-19 Vaccine (1) Never done   HIV Screening  Never done   PAP SMEAR-Modifier  Never done   TETANUS/TDAP  12/17/2015   INFLUENZA VACCINE  05/12/2020     Wt Readings from Last 3 Encounters:  04/16/20 192 lb (87.1 kg)  12/18/19 186 lb (84.4 kg)  11/29/19 185 lb (83.9 kg)   BP Readings from Last 3 Encounters:  04/16/20 123/74  12/18/19 110/66  11/29/19 107/67   Pulse Readings from Last 3 Encounters:  04/16/20 81  12/18/19 71  11/29/19 73     Past Medical History:  Diagnosis Date   Anxiety    Chest pain 02/20/2016   Constipation    Elevated fasting blood sugar    Hyperlipidemia 02/20/2016   Kidney problem    Multiple food allergies    Palpitations 02/20/2016   Renal disorder       Past Surgical History:  Procedure Laterality Date   APPENDECTOMY     CHOLECYSTECTOMY        Family History  Problem Relation Age of Onset   Diabetes Mother    Hypertension Father    Stroke Father    Heart disease Father    Sleep apnea Father    Diabetes Brother    Diabetes Maternal Grandmother    AAA (abdominal aortic aneurysm) Maternal Grandfather    Dementia Paternal Grandfather       Social History    Substance and Sexual Activity  Drug Use No  ,   Social History   Substance and Sexual Activity  Alcohol Use No   Alcohol/week: 0.0 standard drinks  ,   Social History   Tobacco Use  Smoking Status Former Smoker   Quit date: 02/20/1995   Years since quitting: 25.2  Smokeless Tobacco Never Used  Tobacco Comment   social; in college  ,   Social History   Substance and Sexual Activity  Sexual Activity Not on file    Current Outpatient Medications on File Prior to Visit  Medication Sig Dispense Refill   loratadine (CLARITIN) 10 MG tablet Take 10 mg by mouth daily.     Vitamin D, Ergocalciferol, (DRISDOL) 1.25 MG (50000 UNIT) CAPS capsule Take 1 capsule (50,000 Units total) by mouth every 7 (seven) days. 4 capsule 0   No current facility-administered medications on  file prior to visit.    Allergies: Ceclor [cefaclor], Erythromycin, Iodine, and Penicillins  Review of Systems: General:   Denies fever, chills, unexplained weight loss.  Optho/Auditory:   Denies visual changes, blurred vision/LOV Respiratory:   Denies SOB, DOE more than baseline levels.   Cardiovascular:   Denies chest pain, palpitations, new onset peripheral edema  Gastrointestinal:   Denies nausea, vomiting, diarrhea.  Genitourinary: Denies dysuria, freq/ urgency, flank pain   Endocrine:     Denies hot or cold intolerance, polyuria, polydipsia. Musculoskeletal:   Denies unexplained myalgias, joint swelling, unexplained arthralgias, gait problems.  Skin:  Denies rash, suspicious lesions Neurological:     Denies dizziness, unexplained weakness, numbness  Psychiatric/Behavioral:   Denies mood changes, suicidal or homicidal ideations, hallucinations    Objective:    Blood pressure 123/74, pulse 81, temperature 98.2 F (36.8 C), temperature source Oral, height 5\' 5"  (1.651 m), weight 192 lb (87.1 kg), last menstrual period 04/13/2020, SpO2 96 %. Body mass index is 31.95 kg/m. General Appearance:     Alert, cooperative, no distress, appears stated age  Head:    Normocephalic, without obvious abnormality, atraumatic  Eyes:    PERRL, conjunctiva/corneas clear, EOM's intact, fundi    benign, both eyes  Ears:    Normal TM's and external ear canals, both ears  Nose:   Nares normal, septum midline, mucosa normal, no drainage    or sinus tenderness  Throat:   Lips w/o lesion, mucosa moist, and tongue normal; teeth and   gums normal  Neck:   Supple, symmetrical, trachea midline, no adenopathy;    thyroid:  no enlargement/tenderness/nodules; no carotid   bruit or JVD  Back:     Symmetric, no curvature, ROM normal, no CVA tenderness  Lungs:     Clear to auscultation bilaterally, respirations unlabored, no       Wh/ R/ R  Chest Wall:    No tenderness or gross deformity; normal excursion   Heart:    Regular rate and rhythm, S1 and S2 normal, no murmur, rub   or gallop  Breast Exam:    Deferred to OB/GYN.  Abdomen:     Soft, non-tender, NBS, No G/R/R, no masses, no organomegaly  Genitalia:   Deferred to OB/GYN.  Rectal:   Deferred to OB/GYN.  Extremities:   Extremities normal, atraumatic, no cyanosis or gross edema  Pulses:  Normal  Skin:   Warm, dry, Skin color, texture, turgor normal, no obvious rashes or lesions Psych: No HI/SI, judgement and insight good, Euthymic mood. Full Affect.  Neurologic:   CNII-XII grossly intact, normal strength, sensation and reflexes throughout

## 2020-04-18 ENCOUNTER — Other Ambulatory Visit: Payer: Self-pay | Admitting: Physician Assistant

## 2020-04-18 ENCOUNTER — Other Ambulatory Visit: Payer: Self-pay

## 2020-04-18 DIAGNOSIS — R635 Abnormal weight gain: Secondary | ICD-10-CM

## 2020-04-18 DIAGNOSIS — R6882 Decreased libido: Secondary | ICD-10-CM

## 2020-04-18 DIAGNOSIS — F39 Unspecified mood [affective] disorder: Secondary | ICD-10-CM

## 2020-04-18 DIAGNOSIS — T50905A Adverse effect of unspecified drugs, medicaments and biological substances, initial encounter: Secondary | ICD-10-CM

## 2020-04-18 MED ORDER — BUPROPION HCL 75 MG PO TABS: 75.0000 mg | ORAL_TABLET | Freq: Two times a day (BID) | ORAL | 1 refills | Status: DC

## 2020-04-18 MED FILL — BUPROPION HCL 75 MG TABS: 75 | 90 days supply | Qty: 180 | Fill #0

## 2020-04-18 NOTE — Telephone Encounter (Signed)
Received fax from pharmacy stating that RX sent earlier today for #90 is only a 45 day supply.  RX resent for 90 day supply.  Charyl Bigger, CMA

## 2020-04-24 ENCOUNTER — Ambulatory Visit: Payer: No Typology Code available for payment source | Admitting: Audiologist

## 2020-05-01 ENCOUNTER — Ambulatory Visit: Payer: No Typology Code available for payment source | Attending: Physician Assistant | Admitting: Audiologist

## 2020-05-01 ENCOUNTER — Other Ambulatory Visit: Payer: Self-pay

## 2020-05-01 DIAGNOSIS — H903 Sensorineural hearing loss, bilateral: Secondary | ICD-10-CM | POA: Diagnosis not present

## 2020-05-01 NOTE — Procedures (Signed)
  Outpatient Audiology and [place], [place]  [id number] [phone]  AUDIOLOGICAL  EVALUATION  NAME: Denise Strong  DOB:   [birth year]     MRN: [id number]                                            DATE: 05/01/2020     REFERENT: Lorrene Reid, PA-C STATUS: Outpatient DIAGNOSIS: Sensorineural Hearing Loss Bilateral    History: Denise Strong was seen for an audiological evaluation.  Denise Strong is receiving a hearing evaluation due to concerns for decreased hearing. Denise Strong has difficulty hearing in background noise, hearing from her left ear, and hearing her 43 year old. This difficulty has gradually gotten worse. She first noticed the difficulty in her left ear after a migraine in college. She said she had a hearing test at the time had some hearing loss. No pain or pressure reported in either ear today. No tinnitus present in either ear. Denise Strong has no reported history of excessive noise exposure or ear infections. She has not had any migraines since college. Denise Strong has a significant family history of hearing loss. She says her mother and several other family members have hearing loss that onset in their 28s to 36s. This included both the men and women in her family. Her mother had tubes in her ears as an adult to treat the hearing loss. Medical history negative for any risk factors for hearing loss such a kidney disease or diabetes. No other relevant case history reported.   Evaluation:   Otoscopy showed a clear view of the tympanic membranes, bilaterally  Tympanometry results were consistent with normal function of the middle ear with present screener reflexes, bilaterally    Audiometric testing was completed using conventional audiometry with insert transducer. Speech Recognition Thresholds were consistent with pure tone averages. Word Recognition was excellent at conversation and an elevated level. Pure tone thresholds show a symmetric mild sensorineural cookie  bite hearing loss in both ears. Test results are consistent with a mild hearing loss.  See audiogram in media tab.   Results:  The test results were reviewed with Denise Strong. She was informed that this type of hearing loss is not typical hearing loss from age or noise exposure. She is not experiencing any warning signs of ear pathology, such as pain or pressure. This hearing loss is not sudden or rapidly progressing. The loss needs to be montiored for progression due to her family history, and if any warning signs of pathology develop she needs to follow up with an ENT Physcian.  At this time she is not a candidate for amplification. Her hearing loss is mild and her speech scores were excellent at conversation level.  With a mild loss Denise Strong will hear best face to face within five feet of her conversation partner. She will not hear speech from a different room, or when someone talks while walking away. Denise Strong reported understanding all that was discussed.   Recommendations: 1.   Annual audiometric testing recommended to monitor progression of hearing loss in both ears.  2.   Referral to ENT Physician only necessary if patient develops any warning signs of ear pathology, including pain, pressure, tinnitus and/or dizziness.    Alfonse Alpers  Audiologist, Au.D., CCC-A 05/01/2020  3:30 PM  Cc: Lorrene Reid, PA-C

## 2020-07-24 ENCOUNTER — Other Ambulatory Visit: Payer: Self-pay

## 2020-07-24 ENCOUNTER — Other Ambulatory Visit: Payer: No Typology Code available for payment source

## 2020-07-24 DIAGNOSIS — E785 Hyperlipidemia, unspecified: Secondary | ICD-10-CM

## 2020-07-24 DIAGNOSIS — E8881 Metabolic syndrome: Secondary | ICD-10-CM

## 2020-07-24 DIAGNOSIS — E559 Vitamin D deficiency, unspecified: Secondary | ICD-10-CM

## 2020-07-24 DIAGNOSIS — Z114 Encounter for screening for human immunodeficiency virus [HIV]: Secondary | ICD-10-CM

## 2020-07-24 DIAGNOSIS — Z1159 Encounter for screening for other viral diseases: Secondary | ICD-10-CM

## 2020-07-24 DIAGNOSIS — E7849 Other hyperlipidemia: Secondary | ICD-10-CM

## 2020-07-24 DIAGNOSIS — Z0000 Encounter for general adult medical examination without abnormal findings: Secondary | ICD-10-CM

## 2020-07-25 LAB — COMPREHENSIVE METABOLIC PANEL
ALT: 19 IU/L (ref 0–32)
AST: 16 IU/L (ref 0–40)
Albumin/Globulin Ratio: 1.4 (ref 1.2–2.2)
Albumin: 4.3 g/dL (ref 3.8–4.8)
Alkaline Phosphatase: 67 IU/L (ref 44–121)
BUN/Creatinine Ratio: 17 (ref 9–23)
BUN: 10 mg/dL (ref 6–24)
Bilirubin Total: 0.3 mg/dL (ref 0.0–1.2)
CO2: 25 mmol/L (ref 20–29)
Calcium: 9 mg/dL (ref 8.7–10.2)
Chloride: 104 mmol/L (ref 96–106)
Creatinine, Ser: 0.6 mg/dL (ref 0.57–1.00)
GFR calc Af Amer: 129 mL/min/{1.73_m2} (ref >59)
GFR calc non Af Amer: 112 mL/min/{1.73_m2} (ref >59)
Globulin, Total: 3 g/dL (ref 1.5–4.5)
Glucose: 91 mg/dL (ref 65–99)
Potassium: 4.6 mmol/L (ref 3.5–5.2)
Sodium: 144 mmol/L (ref 134–144)
Total Protein: 7.3 g/dL (ref 6.0–8.5)

## 2020-07-25 LAB — CBC WITH DIFFERENTIAL/PLATELET
Basophils Absolute: 0.1 10*3/uL (ref 0.0–0.2)
Basos: 1 %
EOS (ABSOLUTE): 0.1 10*3/uL (ref 0.0–0.4)
Eos: 1 %
Hematocrit: 43.8 % (ref 34.0–46.6)
Hemoglobin: 14.9 g/dL (ref 11.1–15.9)
Immature Grans (Abs): 0 10*3/uL (ref 0.0–0.1)
Immature Granulocytes: 0 %
Lymphocytes Absolute: 2.2 10*3/uL (ref 0.7–3.1)
Lymphs: 33 %
MCH: 30.5 pg (ref 26.6–33.0)
MCHC: 34 g/dL (ref 31.5–35.7)
MCV: 90 fL (ref 79–97)
Monocytes Absolute: 0.4 10*3/uL (ref 0.1–0.9)
Monocytes: 7 %
Neutrophils Absolute: 3.8 10*3/uL (ref 1.4–7.0)
Neutrophils: 58 %
Platelets: 326 10*3/uL (ref 150–450)
RBC: 4.88 x10E6/uL (ref 3.77–5.28)
RDW: 12.7 % (ref 11.7–15.4)
WBC: 6.6 10*3/uL (ref 3.4–10.8)

## 2020-07-25 LAB — LIPID PANEL
Chol/HDL Ratio: 5.8 ratio — ABNORMAL HIGH (ref 0.0–4.4)
Cholesterol, Total: 226 mg/dL — ABNORMAL HIGH (ref 100–199)
HDL: 39 mg/dL — ABNORMAL LOW (ref >39)
LDL Chol Calc (NIH): 122 mg/dL — ABNORMAL HIGH (ref 0–99)
Triglycerides: 366 mg/dL — ABNORMAL HIGH (ref 0–149)
VLDL Cholesterol Cal: 65 mg/dL — ABNORMAL HIGH (ref 5–40)

## 2020-07-25 LAB — HEMOGLOBIN A1C
Est. average glucose Bld gHb Est-mCnc: 120 mg/dL
Hgb A1c MFr Bld: 5.8 % — ABNORMAL HIGH (ref 4.8–5.6)

## 2020-07-25 LAB — TSH: TSH: 1.41 u[IU]/mL (ref 0.450–4.500)

## 2020-07-25 LAB — VITAMIN D 25 HYDROXY (VIT D DEFICIENCY, FRACTURES): Vit D, 25-Hydroxy: 15.8 ng/mL — ABNORMAL LOW (ref 30.0–100.0)

## 2020-07-25 LAB — HEPATITIS C ANTIBODY: Hep C Virus Ab: 0.2 s/co ratio (ref 0.0–0.9)

## 2020-07-25 LAB — HIV ANTIBODY (ROUTINE TESTING W REFLEX): HIV Screen 4th Generation wRfx: NONREACTIVE

## 2020-07-29 ENCOUNTER — Other Ambulatory Visit (HOSPITAL_COMMUNITY): Payer: Self-pay | Admitting: Obstetrics and Gynecology

## 2020-07-29 MED FILL — DOXYCYCLINE HYCLATE 100 MG: 100 | 7 days supply | Qty: 14 | Fill #0

## 2020-08-22 ENCOUNTER — Ambulatory Visit: Payer: No Typology Code available for payment source | Admitting: Physician Assistant

## 2020-09-09 MED FILL — BUPROPION HCL 75 MG TABS: 75 | 90 days supply | Qty: 180 | Fill #1

## 2020-09-25 ENCOUNTER — Ambulatory Visit: Payer: No Typology Code available for payment source | Admitting: Osteopathic Medicine

## 2020-09-26 ENCOUNTER — Ambulatory Visit (INDEPENDENT_AMBULATORY_CARE_PROVIDER_SITE_OTHER): Payer: No Typology Code available for payment source | Admitting: Osteopathic Medicine

## 2020-09-26 ENCOUNTER — Other Ambulatory Visit: Payer: Self-pay

## 2020-09-26 ENCOUNTER — Encounter: Payer: Self-pay | Admitting: Osteopathic Medicine

## 2020-09-26 ENCOUNTER — Other Ambulatory Visit: Payer: Self-pay | Admitting: Osteopathic Medicine

## 2020-09-26 VITALS — BP 136/83 | HR 74 | Temp 98.1°F | Ht 65.0 in | Wt 194.0 lb

## 2020-09-26 DIAGNOSIS — E8881 Metabolic syndrome: Secondary | ICD-10-CM

## 2020-09-26 DIAGNOSIS — F419 Anxiety disorder, unspecified: Secondary | ICD-10-CM

## 2020-09-26 DIAGNOSIS — E559 Vitamin D deficiency, unspecified: Secondary | ICD-10-CM

## 2020-09-26 DIAGNOSIS — E782 Mixed hyperlipidemia: Secondary | ICD-10-CM | POA: Diagnosis not present

## 2020-09-26 DIAGNOSIS — Z0000 Encounter for general adult medical examination without abnormal findings: Secondary | ICD-10-CM | POA: Diagnosis not present

## 2020-09-26 MED ORDER — BUPROPION HCL ER (XL) 150 MG PO TB24: 150.0000 mg | ORAL_TABLET | ORAL | 3 refills | Status: DC

## 2020-09-26 MED ORDER — METFORMIN HCL ER 500 MG PO TB24: 500.0000 mg | ORAL_TABLET | Freq: Every day | ORAL | 11 refills | Status: DC

## 2020-09-26 MED FILL — METFORMIN HCL ER 500 MG TB2: 500 | 30 days supply | Qty: 30 | Fill #0

## 2020-09-26 MED FILL — buPROPion HCL ER (XL) 150 M: 150 | 90 days supply | Qty: 90 | Fill #0

## 2020-09-26 NOTE — Progress Notes (Signed)
Denise Strong is a 44 y.o. female who presents to  [place] at [facility]  today, 09/26/20, seeking care for the following:  . Establish care - very pleasant new patient to establish care. 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  . Prediabetes - DM2 runs in family  . HLD - likely familial, lipitor caused calf myalgia so was d/c, TG high, LDL not terrible  . Working on weight loss  . Mental health - stable on Wellbutrin, previously on Lexapro which worked maybe a bit better but adverse side effects outweighed benefits so Wellbutrin preferred.      ASSESSMENT & PLAN with other pertinent findings:  The primary encounter diagnosis was Encounter for medical examination to establish care. Diagnoses of Vitamin D deficiency, Mixed hyperlipidemia, Insulin resistance, and Anxiety were also pertinent to this visit.  Trial starting Metformin low dose Consider GLP1  HLD consider Omega 3, maybe restart statin lower potency     Patient Instructions  1-2 meals / week of tuna or salmon to help triglycerides Recheck A1C and ipids in 6 mos    Orders Placed This Encounter  Procedures  . Hemoglobin A1c  . Lipid panel    Meds ordered this encounter  Medications  . buPROPion (WELLBUTRIN XL) 150 MG 24 hr tablet    Sig: Take 1 tablet (150 mg total) by mouth every morning.    Dispense:  90 tablet    Refill:  3  . metFORMIN (GLUCOPHAGE XR) 500 MG 24 hr tablet    Sig: Take 1 tablet (500 mg total) by mouth daily with breakfast.    Dispense:  30 tablet    Refill:  11       Follow-up instructions: Return in about 4 months (around 01/25/2021) for recheck labs and weight - labs 2-3 days prior to appt w/ Dr A .                                         BP 136/83 (BP Location: Left Arm, Patient Position: Sitting, Cuff Size: Large)   Pulse 74   Temp 98.1 F (36.7 C) (Oral)   Ht 5\' 5"  (1.651 m)   Wt 194 lb 0.6 oz (88 kg)    BMI 32.29 kg/m   No outpatient medications have been marked as taking for the 09/26/20 encounter (Office Visit) with Emeterio Reeve, DO.    No results found for this or any previous visit (from the past 72 hour(s)).  No results found.     All questions at time of visit were answered - patient instructed to contact office with any additional concerns or updates.  ER/RTC precautions were reviewed with the patient as applicable.   Please note: voice recognition software was used to produce this document, and typos may escape review. Please contact Dr. Sheppard Coil for any needed clarifications.

## 2020-09-26 NOTE — Patient Instructions (Signed)
1-2 meals / week of tuna or salmon to help triglycerides Recheck A1C and ipids in 6 mos

## 2020-09-30 ENCOUNTER — Ambulatory Visit: Payer: No Typology Code available for payment source | Admitting: Physician Assistant

## 2021-01-18 LAB — HEMOGLOBIN A1C
Hgb A1c MFr Bld: 5.6 % of total Hgb (ref <5.7)
Mean Plasma Glucose: 114 mg/dL
eAG (mmol/L): 6.3 mmol/L

## 2021-01-18 LAB — LIPID PANEL
Cholesterol: 205 mg/dL — ABNORMAL HIGH (ref <200)
HDL: 41 mg/dL — ABNORMAL LOW (ref >=50)
LDL Cholesterol (Calc): 108 mg/dL (calc) — ABNORMAL HIGH
Non-HDL Cholesterol (Calc): 164 mg/dL (calc) — ABNORMAL HIGH (ref <130)
Total CHOL/HDL Ratio: 5 (calc) — ABNORMAL HIGH (ref <5.0)
Triglycerides: 394 mg/dL — ABNORMAL HIGH (ref <150)

## 2021-01-22 ENCOUNTER — Ambulatory Visit (INDEPENDENT_AMBULATORY_CARE_PROVIDER_SITE_OTHER): Payer: No Typology Code available for payment source | Admitting: Osteopathic Medicine

## 2021-01-22 ENCOUNTER — Encounter: Payer: Self-pay | Admitting: Osteopathic Medicine

## 2021-01-22 ENCOUNTER — Other Ambulatory Visit: Payer: Self-pay | Admitting: Osteopathic Medicine

## 2021-01-22 ENCOUNTER — Other Ambulatory Visit: Payer: Self-pay

## 2021-01-22 ENCOUNTER — Other Ambulatory Visit (HOSPITAL_COMMUNITY): Payer: Self-pay

## 2021-01-22 VITALS — BP 116/66 | HR 69 | Temp 97.7°F | Ht 65.0 in | Wt 187.5 lb

## 2021-01-22 DIAGNOSIS — E7801 Familial hypercholesterolemia: Secondary | ICD-10-CM | POA: Diagnosis not present

## 2021-01-22 DIAGNOSIS — R7303 Prediabetes: Secondary | ICD-10-CM

## 2021-01-22 DIAGNOSIS — E781 Pure hyperglyceridemia: Secondary | ICD-10-CM | POA: Diagnosis not present

## 2021-01-22 DIAGNOSIS — Z789 Other specified health status: Secondary | ICD-10-CM

## 2021-01-22 DIAGNOSIS — E782 Mixed hyperlipidemia: Secondary | ICD-10-CM | POA: Diagnosis not present

## 2021-01-22 DIAGNOSIS — E559 Vitamin D deficiency, unspecified: Secondary | ICD-10-CM

## 2021-01-22 MED ORDER — METFORMIN HCL ER 500 MG PO TB24
500.0000 mg | ORAL_TABLET | Freq: Every day | ORAL | 3 refills | Status: DC
  Filled 2021-01-22: qty 90, 90d supply, fill #0
  Filled 2021-05-12: qty 90, 90d supply, fill #1

## 2021-01-22 MED ORDER — BUPROPION HCL ER (XL) 150 MG PO TB24
150.0000 mg | ORAL_TABLET | Freq: Every morning | ORAL | 3 refills | Status: DC
  Filled 2021-01-22: qty 90, 90d supply, fill #0
  Filled 2021-05-12: qty 90, 90d supply, fill #1

## 2021-01-22 MED ORDER — OMEGA-3-ACID ETHYL ESTERS 1 G PO CAPS
2.0000 g | ORAL_CAPSULE | Freq: Two times a day (BID) | ORAL | 3 refills | Status: DC
  Filled 2021-01-22: qty 360, 90d supply, fill #0

## 2021-01-22 MED ORDER — LORATADINE 10 MG PO TABS
10.0000 mg | ORAL_TABLET | Freq: Every day | ORAL | 3 refills | Status: DC
  Filled 2021-01-22: qty 90, 90d supply, fill #0

## 2021-01-22 NOTE — Progress Notes (Signed)
Denise Strong is a 45 y.o. female who presents to  [place] at [facility]  today, 01/22/21, seeking care for the following:  Review labs -  HLD: cholesterol a bit better other than TG elevated, >300 Pre-DM: A1C improved!  Weight check: also improved  Working some on diet/exercise, metformin also probably helpful  BP Readings from Last 3 Encounters:  01/22/21 116/66  09/26/20 136/83  04/16/20 123/74   Wt Readings from Last 3 Encounters:  01/22/21 187 lb 8 oz (85 kg)  09/26/20 194 lb 0.6 oz (88 kg)  04/16/20 192 lb (87.1 kg)      ASSESSMENT & PLAN with other pertinent findings:  The primary encounter diagnosis was Hypertriglyceridemia. Diagnoses of Vitamin D deficiency, Mixed hyperlipidemia, Familial hypercholesterolemia, Prediabetes, and Statin intolerance - severe myalgia w/ atorvastatin  were also pertinent to this visit.   Continue meds, adding omega-3 d/t stain intolerance and TG >300, prediabetes   There are no Patient Instructions on file for this visit.  Orders Placed This Encounter  Procedures  . Lipid Panel w/reflex Direct LDL  . VITAMIN D 25 Hydroxy (Vit-D Deficiency, Fractures)  . Hemoglobin A1c    Meds ordered this encounter  Medications  . omega-3 acid ethyl esters (LOVAZA) 1 g capsule    Sig: Take 2 capsules (2 g total) by mouth 2 (two) times daily.    Dispense:  360 capsule    Refill:  3  . metFORMIN (GLUCOPHAGE-XR) 500 MG 24 hr tablet    Sig: Take 1 tablet (500 mg total) by mouth daily with breakfast.    Dispense:  90 tablet    Refill:  3  . buPROPion (WELLBUTRIN XL) 150 MG 24 hr tablet    Sig: Take 1 tablet (150 mg total) by mouth every morning.    Dispense:  90 tablet    Refill:  3  . loratadine (CLARITIN) 10 MG tablet    Sig: Take 1 tablet (10 mg total) by mouth daily.    Dispense:  90 tablet    Refill:  3     See below for relevant physical exam findings  See below for recent lab and  imaging results reviewed  Medications, allergies, PMH, PSH, SocH, FamH reviewed below    Follow-up instructions: Return in about 3 months (around 04/23/2021) for RECHECK LABS - CHOLESTEROL, SUGARS, VITAMIN D (labs 2+ days prior to appt, orders are in).                                        Exam:  BP 116/66 (BP Location: Left Arm, Patient Position: Sitting, Cuff Size: Large)   Pulse 69   Temp 97.7 F (36.5 C)   Ht 5\' 5"  (1.651 m)   Wt 187 lb 8 oz (85 kg)   SpO2 98%   BMI 31.20 kg/m   Constitutional: VS see above. General Appearance: alert, well-developed, well-nourished, NAD  Neck: No masses, trachea midline.   Respiratory: Normal respiratory effort. no wheeze, no rhonchi, no rales  Cardiovascular: S1/S2 normal, no murmur, no rub/gallop auscultated. RRR.   Musculoskeletal: Gait normal. Symmetric and independent movement of all extremities  Neurological: Normal balance/coordination. No tremor.  Skin: warm, dry, intact.   Psychiatric: Normal judgment/insight. Normal mood and affect. Oriented x3.   Current Meds  Medication Sig  . omega-3 acid ethyl esters (LOVAZA) 1 g capsule Take  2 capsules (2 g total) by mouth 2 (two) times daily.  . [DISCONTINUED] buPROPion (WELLBUTRIN XL) 150 MG 24 hr tablet TAKE 1 TABLET (150 MG TOTAL) BY MOUTH EVERY MORNING.  . [DISCONTINUED] loratadine (CLARITIN) 10 MG tablet Take 10 mg by mouth daily.  . [DISCONTINUED] metFORMIN (GLUCOPHAGE-XR) 500 MG 24 hr tablet TAKE 1 TABLET (500 MG TOTAL) BY MOUTH DAILY WITH BREAKFAST.    Allergies  Allergen Reactions  . Ceclor [Cefaclor]   . Erythromycin   . Iodine   . Penicillins     Patient Active Problem List   Diagnosis Date Noted  . Insulin resistance 08/11/2019  . Vitamin D deficiency 07/27/2019  . Class 1 obesity with serious comorbidity and body mass index (BMI) of 30.0 to 30.9 in adult 07/27/2019  . Low libido 07/19/2018  . Mood disorder ([place]) 05/13/2018   . Environmental and seasonal allergies 05/13/2018  . Hyperlipidemia 02/20/2016  . Palpitations 02/13/2016  . Allergic rhinitis 03/21/2014  . 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 Migraine headache 03/21/2014  . 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 Lower urinary tract infectious disease 03/07/2014    Family History  Problem Relation Age of Onset  . Diabetes Mother   . Hypertension Father   . Stroke Father   . Heart disease Father   . Sleep apnea Father   . Diabetes Brother   . Diabetes Maternal Grandmother   . AAA (abdominal aortic aneurysm) Maternal Grandfather   . Dementia Paternal Grandfather     Social History   Tobacco Use  Smoking Status Former Smoker  . Quit date: 02/20/1995  . Years since quitting: 25.9  Smokeless Tobacco Never Used  Tobacco Comment   social; in college    Past Surgical History:  Procedure Laterality Date  . APPENDECTOMY    . CHOLECYSTECTOMY      Immunization History  Administered Date(s) Administered  . Influenza-Unspecified 07/12/2013, 07/16/2020  . PFIZER(Purple Top)SARS-COV-2 Vaccination 10/04/2019, 10/23/2019, 08/30/2020  . Tdap 12/16/2005    Recent Results (from the past 2160 hour(s))  Hemoglobin A1c     Status: None   Collection Time: 01/17/21 12:00 AM  Result Value Ref Range   Hgb A1c MFr Bld 5.6 <5.7 % of total Hgb    Comment: For the purpose of screening for the presence of diabetes: . <5.7%       Consistent with the absence of diabetes 5.7-6.4%    Consistent with increased risk for diabetes             (prediabetes) > or =6.5%  Consistent with diabetes . This assay result is consistent with a decreased risk of diabetes. . Currently, no consensus exists regarding use of hemoglobin A1c for diagnosis of diabetes in children. . According to American Diabetes Association (ADA) guidelines, hemoglobin A1c <7.0% represents optimal control in non-pregnant diabetic patients. Different metrics may apply to  specific patient populations.  Standards of Medical Care in Diabetes(ADA). .    Mean Plasma Glucose 114 mg/dL   eAG (mmol/L) 6.3 mmol/L  Lipid panel     Status: Abnormal   Collection Time: 01/17/21 12:00 AM  Result Value Ref Range   Cholesterol 205 (H) <200 mg/dL   HDL 41 (L) > OR = 50 mg/dL   Triglycerides 394 (H) <150 mg/dL    Comment: . If a non-fasting specimen was collected, consider repeat triglyceride testing on a fasting specimen if clinically indicated.  Yates Decamp et al. J. of Clin. Lipidol. 9937;1:696-789. Marland Kitchen    LDL Cholesterol (Calc)  108 (H) mg/dL (calc)    Comment: Reference range: <100 . Desirable range <100 mg/dL for primary prevention;   <70 mg/dL for patients with CHD or diabetic patients  with > or = 2 CHD risk factors. Marland Kitchen LDL-C is now calculated using the Martin-Hopkins  calculation, which is a validated novel method providing  better accuracy than the Friedewald equation in the  estimation of LDL-C.  Cresenciano Genre et al. Annamaria Helling. 1610;960(45): 2061-2068  (http://education.[facility].com/faq/FAQ164)    Total CHOL/HDL Ratio 5.0 (H) <5.0 (calc)   Non-HDL Cholesterol (Calc) 164 (H) <130 mg/dL (calc)    Comment: For patients with diabetes plus 1 major ASCVD risk  factor, treating to a non-HDL-C goal of <100 mg/dL  (LDL-C of <70 mg/dL) is considered a therapeutic  option.     No results found.     All questions at time of visit were answered - patient instructed to contact office with any additional concerns or updates. ER/RTC precautions were reviewed with the patient as applicable.   Please note: manual typing as well as voice recognition software may have been used to produce this document - typos may escape review. Please contact Dr. Sheppard Coil for any needed clarifications.

## 2021-01-28 ENCOUNTER — Other Ambulatory Visit (HOSPITAL_COMMUNITY): Payer: Self-pay

## 2021-04-01 ENCOUNTER — Encounter: Payer: Self-pay | Admitting: Osteopathic Medicine

## 2021-04-19 LAB — HEMOGLOBIN A1C
Hgb A1c MFr Bld: 5.5 % of total Hgb (ref <5.7)
Mean Plasma Glucose: 111 mg/dL
eAG (mmol/L): 6.2 mmol/L

## 2021-04-19 LAB — LIPID PANEL W/REFLEX DIRECT LDL
Cholesterol: 236 mg/dL — ABNORMAL HIGH (ref <200)
HDL: 45 mg/dL — ABNORMAL LOW (ref >=50)
LDL Cholesterol (Calc): 140 mg/dL (calc) — ABNORMAL HIGH
Non-HDL Cholesterol (Calc): 191 mg/dL (calc) — ABNORMAL HIGH (ref <130)
Total CHOL/HDL Ratio: 5.2 (calc) — ABNORMAL HIGH (ref <5.0)
Triglycerides: 330 mg/dL — ABNORMAL HIGH (ref <150)

## 2021-04-19 LAB — VITAMIN D 25 HYDROXY (VIT D DEFICIENCY, FRACTURES): Vit D, 25-Hydroxy: 25 ng/mL — ABNORMAL LOW (ref 30–100)

## 2021-04-22 ENCOUNTER — Other Ambulatory Visit: Payer: Self-pay

## 2021-04-22 ENCOUNTER — Ambulatory Visit (INDEPENDENT_AMBULATORY_CARE_PROVIDER_SITE_OTHER): Payer: No Typology Code available for payment source | Admitting: Osteopathic Medicine

## 2021-04-22 ENCOUNTER — Other Ambulatory Visit (HOSPITAL_COMMUNITY): Payer: Self-pay

## 2021-04-22 ENCOUNTER — Encounter: Payer: Self-pay | Admitting: Osteopathic Medicine

## 2021-04-22 VITALS — BP 106/66 | HR 74 | Ht 65.0 in | Wt 191.3 lb

## 2021-04-22 DIAGNOSIS — E7801 Familial hypercholesterolemia: Secondary | ICD-10-CM

## 2021-04-22 DIAGNOSIS — E781 Pure hyperglyceridemia: Secondary | ICD-10-CM | POA: Diagnosis not present

## 2021-04-22 DIAGNOSIS — R5382 Chronic fatigue, unspecified: Secondary | ICD-10-CM

## 2021-04-22 DIAGNOSIS — R0681 Apnea, not elsewhere classified: Secondary | ICD-10-CM

## 2021-04-22 DIAGNOSIS — E782 Mixed hyperlipidemia: Secondary | ICD-10-CM

## 2021-04-22 DIAGNOSIS — R7309 Other abnormal glucose: Secondary | ICD-10-CM

## 2021-04-22 DIAGNOSIS — E559 Vitamin D deficiency, unspecified: Secondary | ICD-10-CM | POA: Diagnosis not present

## 2021-04-22 DIAGNOSIS — R0683 Snoring: Secondary | ICD-10-CM

## 2021-04-22 MED ORDER — OMEGA-3-ACID ETHYL ESTERS 1 G PO CAPS
2.0000 g | ORAL_CAPSULE | Freq: Two times a day (BID) | ORAL | 3 refills | Status: DC
  Filled 2021-04-22 – 2021-08-04 (×4): qty 360, 90d supply, fill #0

## 2021-04-22 NOTE — Progress Notes (Signed)
Denise Strong is a 45 y.o. 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 Diagnoses of Mixed hyperlipidemia, Familial hypercholesterolemia, Vitamin D deficiency, Elevated hemoglobin A1c, Snoring, Chronic fatigue, and Witnessed apneic spells were also pertinent to this visit.   Minimal improvement in TG LDL a bit up  Not currently on Lovaza  Started OTC Vitamin D  A1C looks awesome!  Sleep study ordered   The 10-year ASCVD risk score Mikey Bussing DC Brooke Bonito., et al., 2013) is: 1%   Values used to calculate the score:     Age: 69 years     Sex: Female     Is Non-Hispanic African American: No     Diabetic: No     Tobacco smoker: No     Systolic Blood Pressure: 161 mmHg     Is BP treated: No     HDL Cholesterol: 45 mg/dL     Total Cholesterol: 236 mg/dL   Patient Instructions  Restart Lovaza Continue OTC Vitamin D  Will recheck A1C, Lipids, and Vit D in a few mos  If you don't hear back about sleep study, let me know!   Orders Placed This Encounter  Procedures   Lipid panel   VITAMIN D 25 Hydroxy (Vit-D Deficiency, Fractures)   Hemoglobin A1c   Home sleep test    Meds ordered this encounter  Medications   omega-3 acid ethyl esters (LOVAZA) 1 g capsule    Sig: Take 2 capsules (2 g total) by mouth 2 (two) times daily.    Dispense:  360 capsule    Refill:  3     See below for relevant physical exam findings  See below for recent lab and imaging results reviewed  Medications, allergies, PMH, PSH, SocH, FamH reviewed below    Follow-up instructions: Return for recheck labs in another 3-4 months, SEE  [place] SOONER IF NEEDED. CALL/MESSAGE W/ QUESTIONS.                                        Exam:  BP 106/66   Pulse 74   Ht 5\' 5"  (1.651 m)   Wt 191 lb 4.8 oz (86.8 kg)   SpO2 96%   BMI 31.83 kg/m  Constitutional: VS see above. General Appearance: alert, well-developed, well-nourished, NAD Neck: No masses, trachea midline.  Respiratory: Normal respiratory effort.  Musculoskeletal: Gait normal. Symmetric and independent movement of all extremities Neurological: Normal balance/coordination. No tremor. Skin: warm, dry, intact.  Psychiatric: Normal judgment/insight. Normal mood and affect. Oriented x3.   Current Meds  Medication Sig   buPROPion (WELLBUTRIN XL) 150 MG 24 hr tablet Take 1 tablet (150 mg total) by mouth every morning.   metFORMIN (GLUCOPHAGE-XR) 500 MG 24 hr tablet Take 1 tablet (500 mg total) by mouth daily with breakfast.    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   Migraine headache 03/21/2014   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   Lower urinary tract infectious disease 03/07/2014    Family History  Problem Relation Age of Onset   Diabetes Mother    Hypertension Father    Stroke Father    Heart disease Father    Sleep apnea Father    Diabetes Brother    Diabetes Maternal Grandmother    AAA (abdominal aortic aneurysm) Maternal Grandfather     Dementia Paternal Grandfather     Social History   Tobacco Use  Smoking Status Former   Pack years: 0.00   Types: Cigarettes   Quit date: 02/20/1995   Years since quitting: 26.1  Smokeless Tobacco Never  Tobacco Comments   social; in college    Past Surgical History:  Procedure Laterality Date   APPENDECTOMY     CHOLECYSTECTOMY      Immunization History  Administered Date(s) Administered   Influenza-Unspecified 07/12/2013, 07/16/2020   PFIZER(Purple Top)SARS-COV-2 Vaccination 10/04/2019, 10/23/2019, 08/30/2020   Tdap 12/16/2005    Recent Results (from the past 2160 hour(s))  Lipid Panel w/reflex Direct LDL     Status: Abnormal   Collection Time: 04/18/21  9:40 AM  Result Value Ref Range   Cholesterol 236 (H) <200 mg/dL   HDL 45 (L) > OR = 50 mg/dL   Triglycerides 330 (H) <150 mg/dL    Comment: . If a non-fasting specimen was collected, consider repeat triglyceride testing on a fasting specimen if clinically indicated.  Yates Decamp et al. J. of Clin. Lipidol. 0960;4:540-981. Marland Kitchen    LDL Cholesterol (Calc) 140 (H) mg/dL (calc)    Comment: Reference range: <100 . Desirable range <100 mg/dL for primary prevention;   <70 mg/dL for patients with CHD or diabetic patients  with > or = 2 CHD risk factors. Marland Kitchen LDL-C is now calculated using the Martin-Hopkins  calculation, which is a validated novel method providing  better accuracy than the Friedewald equation in the  estimation of LDL-C.  Cresenciano Genre et al. Annamaria Helling. 1914;782(95): 2061-2068  (http://education.Quest[facility].com/faq/FAQ164)    Total CHOL/HDL Ratio 5.2 (H) <5.0 (calc)   Non-HDL Cholesterol (Calc) 191 (H) <130 mg/dL (calc)    Comment: For patients with diabetes plus 1 major ASCVD risk  factor, treating to a non-HDL-C goal of <100 mg/dL  (LDL-C of <70 mg/dL) is considered a therapeutic  option.   VITAMIN D 25 Hydroxy (Vit-D Deficiency, Fractures)     Status: Abnormal   Collection Time: 04/18/21  9:40 AM   Result Value Ref Range   Vit D, 25-Hydroxy 25 (L) 30 - 100 ng/mL    Comment: Vitamin D Status         25-OH Vitamin D: . Deficiency:                    <20 ng/mL Insufficiency:             20 - 29 ng/mL Optimal:                 > or = 30 ng/mL . For 25-OH Vitamin D testing on patients on  D2-supplementation and patients for whom quantitation  of D2 and D3 fractions is required, the QuestAssureD(TM) 25-OH VIT D, (D2,D3), LC/MS/MS is recommended: order  code [id number] (patients >56yrs). See Note 1 . Note 1 . For additional information, please refer to  http://education.[facility].com/faq/FAQ199  (This link is being provided for informational/ educational purposes only.)   Hemoglobin A1c     Status: None   Collection Time: 04/18/21  9:40 AM  Result Value Ref Range   Hgb A1c MFr Bld 5.5 <5.7 % of total Hgb    Comment: For the purpose of screening for the presence of diabetes: . <5.7%       Consistent with the absence of diabetes 5.7-6.4%    Consistent with increased risk for diabetes             (prediabetes) > or =6.5%  Consistent with diabetes . This assay result is consistent with a decreased risk of diabetes. . Currently, no consensus exists regarding use of hemoglobin A1c for diagnosis of diabetes in children. . According to American Diabetes Association (ADA) guidelines, hemoglobin A1c <7.0% represents optimal control in non-pregnant diabetic patients. Different metrics may apply to specific patient populations.  Standards of Medical Care in Diabetes(ADA). .    Mean Plasma Glucose 111 mg/dL   eAG (mmol/L) 6.2 mmol/L    No results found.     All questions at time of visit were answered - patient instructed to contact office with any additional concerns or updates. ER/RTC precautions were reviewed with the patient as applicable.   Please note: manual typing as well as voice recognition software may have been used to produce this document - typos may escape  review. Please contact Dr. Sheppard Coil for any needed clarifications.

## 2021-04-22 NOTE — Patient Instructions (Signed)
Restart Lovaza Continue OTC Vitamin D  Will recheck A1C, Lipids, and Vit D in a few mos  If you don't hear back about sleep study, let me know!

## 2021-04-23 ENCOUNTER — Ambulatory Visit: Payer: No Typology Code available for payment source | Admitting: Osteopathic Medicine

## 2021-04-23 ENCOUNTER — Other Ambulatory Visit (HOSPITAL_COMMUNITY): Payer: Self-pay

## 2021-05-01 ENCOUNTER — Encounter: Payer: Self-pay | Admitting: Osteopathic Medicine

## 2021-05-06 ENCOUNTER — Other Ambulatory Visit: Payer: Self-pay

## 2021-05-06 DIAGNOSIS — E7801 Familial hypercholesterolemia: Secondary | ICD-10-CM

## 2021-05-06 DIAGNOSIS — E559 Vitamin D deficiency, unspecified: Secondary | ICD-10-CM

## 2021-05-06 DIAGNOSIS — R5382 Chronic fatigue, unspecified: Secondary | ICD-10-CM

## 2021-05-06 DIAGNOSIS — R7309 Other abnormal glucose: Secondary | ICD-10-CM

## 2021-05-06 DIAGNOSIS — E781 Pure hyperglyceridemia: Secondary | ICD-10-CM

## 2021-05-06 DIAGNOSIS — R0681 Apnea, not elsewhere classified: Secondary | ICD-10-CM

## 2021-05-06 DIAGNOSIS — R0683 Snoring: Secondary | ICD-10-CM

## 2021-05-06 DIAGNOSIS — E782 Mixed hyperlipidemia: Secondary | ICD-10-CM

## 2021-05-12 ENCOUNTER — Other Ambulatory Visit (HOSPITAL_COMMUNITY): Payer: Self-pay

## 2021-05-13 NOTE — Telephone Encounter (Signed)
Order was put in wrong it is now corrected and I am faxing to [facility] once Denise Strong is done they will contact patient for scheduling. - CF

## 2021-07-01 ENCOUNTER — Ambulatory Visit (HOSPITAL_BASED_OUTPATIENT_CLINIC_OR_DEPARTMENT_OTHER): Payer: No Typology Code available for payment source | Admitting: Internal Medicine

## 2021-07-01 ENCOUNTER — Other Ambulatory Visit: Payer: Self-pay

## 2021-07-01 DIAGNOSIS — E782 Mixed hyperlipidemia: Secondary | ICD-10-CM

## 2021-07-01 DIAGNOSIS — R7309 Other abnormal glucose: Secondary | ICD-10-CM

## 2021-07-01 DIAGNOSIS — R0681 Apnea, not elsewhere classified: Secondary | ICD-10-CM

## 2021-07-01 DIAGNOSIS — R0683 Snoring: Secondary | ICD-10-CM

## 2021-07-01 DIAGNOSIS — E7801 Familial hypercholesterolemia: Secondary | ICD-10-CM

## 2021-07-01 DIAGNOSIS — E559 Vitamin D deficiency, unspecified: Secondary | ICD-10-CM

## 2021-07-01 DIAGNOSIS — E781 Pure hyperglyceridemia: Secondary | ICD-10-CM

## 2021-07-01 DIAGNOSIS — R5382 Chronic fatigue, unspecified: Secondary | ICD-10-CM

## 2021-07-03 ENCOUNTER — Ambulatory Visit (HOSPITAL_BASED_OUTPATIENT_CLINIC_OR_DEPARTMENT_OTHER): Payer: No Typology Code available for payment source | Attending: Osteopathic Medicine | Admitting: Internal Medicine

## 2021-07-03 VITALS — Ht 65.0 in | Wt 190.0 lb

## 2021-07-03 DIAGNOSIS — G4733 Obstructive sleep apnea (adult) (pediatric): Secondary | ICD-10-CM | POA: Diagnosis not present

## 2021-07-12 DIAGNOSIS — G4733 Obstructive sleep apnea (adult) (pediatric): Secondary | ICD-10-CM | POA: Diagnosis not present

## 2021-07-12 NOTE — Procedures (Signed)
   Patient Name: Denise Strong, Denise Strong Date: 07/03/2021 Gender: Female D.O.B: 1976/07/07 Age (years): 45 Referring Provider: Emeterio Reeve Height (inches): 85 Interpreting Physician: Baird Lyons MD, ABSM Weight (lbs): 190 RPSGT: Jacolyn Reedy BMI: 32 MRN: [id number] Neck Size: 15.00 <br> <br> CLINICAL INFORMATION Sleep Study Type: HST Indication for sleep study: OSA Epworth Sleepiness Score: 6  SLEEP STUDY TECHNIQUE A multi-channel overnight portable sleep study was performed. The channels recorded were: nasal airflow, thoracic respiratory movement, and oxygen saturation with a pulse oximetry. Snoring was also monitored.  MEDICATIONS Patient self administered medications include: none reporrted.  SLEEP ARCHITECTURE Patient was studied for 401.4 minutes. The sleep efficiency was 100.0 % and the patient was supine for 0%. The arousal index was 0.0 per hour.  RESPIRATORY PARAMETERS The overall AHI was 6.1 per hour, with a central apnea index of 0 per hour. The oxygen nadir was 92% during sleep.  CARDIAC DATA Mean heart rate during sleep was 62.3 bpm.  IMPRESSIONS - Mild obstructive sleep apnea occurred during this study (AHI = 6.1/h). - The patient had minimal or no oxygen desaturation during the study (Min O2 = 92%) - Patient snored DIAGNOSIS - Obstructive Sleep Apnea (G47.33)  RECOMMENDATIONS - Conservative measures may be sufficient. Other options including CPAP, a fitted oral appliance or ENT eevaluation, would b ebased on clinical judgment. - Be careful with  alcohol, sedatives and other CNS depressants that may worsen sleep apnea and disrupt normal sleep architecture.  [Electronically signed] 07/12/2021 11:48 AM  Baird Lyons MD, ABSM Diplomate, American Board of Sleep Medicine NPI: [id number]                          [place], [place] [facility]  ELECTRONICALLY SIGNED ON:  07/12/2021, 11:41  AM [place] PH: [phone]   FX: [phone] [place]

## 2021-07-12 NOTE — Procedures (Signed)
    Patient Name: Denise Strong, Denise Strong Date: 07/03/2021 Gender: Female D.O.B: 1975-11-14 Age (years): 45 Referring Provider: Emeterio Reeve Height (inches): 61 Interpreting Physician: Baird Lyons MD, ABSM Weight (lbs): 190 RPSGT: Jacolyn Reedy BMI: 32 MRN: [id number] Neck Size: 15.00 <br> <br> CLINICAL INFORMATION Sleep Study Type: HST Indication for sleep study: OSA Epworth Sleepiness Score: 6  SLEEP STUDY TECHNIQUE A multi-channel overnight portable sleep study was performed. The channels recorded were: nasal airflow, thoracic respiratory movement, and oxygen saturation with a pulse oximetry. Snoring was also monitored.  MEDICATIONS Patient self administered medications include: none reporrted.  SLEEP ARCHITECTURE Patient was studied for 401.4 minutes. The sleep efficiency was 100.0 % and the patient was supine for 0%. The arousal index was 0.0 per hour.  RESPIRATORY PARAMETERS The overall AHI was 6.1 per hour, with a central apnea index of 0 per hour. The oxygen nadir was 92% during sleep.  CARDIAC DATA Mean heart rate during sleep was 62.3 bpm.  IMPRESSIONS - Mild obstructive sleep apnea occurred during this study (AHI = 6.1/h). - The patient had minimal or no oxygen desaturation during the study (Min O2 = 92%) - Patient snored  DIAGNOSIS - Obstructive Sleep Apnea (G47.33)  RECOMMENDATIONS - Conservative measures may be sufficient. Other options including CPAP, a fitted oral appliance or ENT eevaluation, would b ebased on clinical judgment. - Be careful with  alcohol, sedatives and other CNS depressants that may worsen sleep apnea and disrupt normal sleep architecture.  [Electronically signed] 07/12/2021 11:48 AM  Baird Lyons MD, ABSM Diplomate, American Board of Sleep Medicine   NPI: [id number]                         [place], [place] [facility] Sleep [facility]  ELECTRONICALLY SIGNED ON:  07/12/2021, 11:55  AM [place] PH: [phone]   FX: [phone] [place]

## 2021-07-15 LAB — HEMOGLOBIN A1C
Hgb A1c MFr Bld: 5.5 % of total Hgb (ref <5.7)
Mean Plasma Glucose: 111 mg/dL
eAG (mmol/L): 6.2 mmol/L

## 2021-07-15 LAB — LIPID PANEL
Cholesterol: 260 mg/dL — ABNORMAL HIGH (ref <200)
HDL: 40 mg/dL — ABNORMAL LOW (ref >=50)
Non-HDL Cholesterol (Calc): 220 mg/dL (calc) — ABNORMAL HIGH (ref <130)
Total CHOL/HDL Ratio: 6.5 (calc) — ABNORMAL HIGH (ref <5.0)
Triglycerides: 576 mg/dL — ABNORMAL HIGH (ref <150)

## 2021-07-15 LAB — VITAMIN D 25 HYDROXY (VIT D DEFICIENCY, FRACTURES): Vit D, 25-Hydroxy: 20 ng/mL — ABNORMAL LOW (ref 30–100)

## 2021-07-16 NOTE — Progress Notes (Signed)
A1C looks good.  Vitamin D still low. How much are you taking daily? Make sure taking with meals or some protein to absorb better.  TG still very high are you taking lovaza? We need to add a statin to this are you ok with that?

## 2021-07-18 ENCOUNTER — Telehealth: Payer: Self-pay | Admitting: Family Medicine

## 2021-07-18 NOTE — Telephone Encounter (Signed)
Please contact the patient for scheduling a virtual or in office appointment to discuss sleep study results. Thanks in advance.

## 2021-07-18 NOTE — Telephone Encounter (Signed)
Please call patient and let her know that we do we have her sure sleep study results back and we are happy to go over those with her we can do that via virtual visit or a telephone call visit.  Please schedule with Eldred Manges, or Alden Hipp

## 2021-07-18 NOTE — Telephone Encounter (Signed)
Agree with documentation as above.   Shalisha Clausing, MD  

## 2021-07-18 NOTE — Telephone Encounter (Signed)
Patient is out of town at [employer] and won't be backuntil next week, she states she has a follow up visit with [name] on Wednesday, October 12th, if she could go over those results then at her visit. AM

## 2021-07-23 ENCOUNTER — Ambulatory Visit (INDEPENDENT_AMBULATORY_CARE_PROVIDER_SITE_OTHER): Payer: No Typology Code available for payment source | Admitting: Medical-Surgical

## 2021-07-23 ENCOUNTER — Other Ambulatory Visit (HOSPITAL_COMMUNITY): Payer: Self-pay

## 2021-07-23 ENCOUNTER — Encounter: Payer: Self-pay | Admitting: Medical-Surgical

## 2021-07-23 ENCOUNTER — Ambulatory Visit: Payer: No Typology Code available for payment source | Admitting: Osteopathic Medicine

## 2021-07-23 VITALS — BP 114/71 | HR 80 | Resp 20 | Ht 65.0 in | Wt 193.6 lb

## 2021-07-23 DIAGNOSIS — E781 Pure hyperglyceridemia: Secondary | ICD-10-CM

## 2021-07-23 DIAGNOSIS — Z23 Encounter for immunization: Secondary | ICD-10-CM

## 2021-07-23 DIAGNOSIS — E7801 Familial hypercholesterolemia: Secondary | ICD-10-CM | POA: Diagnosis not present

## 2021-07-23 DIAGNOSIS — R7309 Other abnormal glucose: Secondary | ICD-10-CM | POA: Diagnosis not present

## 2021-07-23 DIAGNOSIS — E559 Vitamin D deficiency, unspecified: Secondary | ICD-10-CM

## 2021-07-23 DIAGNOSIS — F39 Unspecified mood [affective] disorder: Secondary | ICD-10-CM

## 2021-07-23 DIAGNOSIS — R635 Abnormal weight gain: Secondary | ICD-10-CM

## 2021-07-23 DIAGNOSIS — Z7689 Persons encountering health services in other specified circumstances: Secondary | ICD-10-CM

## 2021-07-23 DIAGNOSIS — G4733 Obstructive sleep apnea (adult) (pediatric): Secondary | ICD-10-CM

## 2021-07-23 MED ORDER — ROSUVASTATIN CALCIUM 10 MG PO TABS
10.0000 mg | ORAL_TABLET | Freq: Every day | ORAL | 3 refills | Status: DC
  Filled 2021-07-23: qty 90, 90d supply, fill #0
  Filled 2021-10-27: qty 90, 90d supply, fill #1
  Filled 2022-02-14: qty 90, 90d supply, fill #2
  Filled 2022-05-22: qty 90, 90d supply, fill #3

## 2021-07-23 MED ORDER — BUPROPION HCL ER (XL) 150 MG PO TB24
150.0000 mg | ORAL_TABLET | Freq: Every morning | ORAL | 3 refills | Status: DC
  Filled 2021-07-23: qty 90, 90d supply, fill #0
  Filled 2021-11-23: qty 90, 90d supply, fill #1
  Filled 2022-03-17: qty 90, 90d supply, fill #2
  Filled 2022-06-29 (×2): qty 90, 90d supply, fill #3

## 2021-07-23 MED ORDER — METFORMIN HCL ER 500 MG PO TB24
500.0000 mg | ORAL_TABLET | Freq: Every day | ORAL | 3 refills | Status: DC
  Filled 2021-07-23: qty 90, 90d supply, fill #0
  Filled 2022-02-11: qty 90, 90d supply, fill #1
  Filled 2022-05-22: qty 90, 90d supply, fill #2

## 2021-07-23 MED ORDER — TIRZEPATIDE 2.5 MG/0.5ML ~~LOC~~ SOAJ
2.5000 mg | SUBCUTANEOUS | 0 refills | Status: DC
  Filled 2021-07-23 (×2): qty 2, 28d supply, fill #0

## 2021-07-23 NOTE — Patient Instructions (Addendum)
Look up macro diet and macro diet recipes (cookbook)  Weight loss tips and tricks:  1.  Make sure to drink at least 64 ounces of water every day. 2.  Avoid alcohol. 3.  Avoid eating within 3 hours of going to bed. 4.  Cut out sugary drinks such as sweet tea, regular sodas, energy drinks, etc. 5.  Make sure you are getting enough sleep every night. 6.  Start making changes by cutting back portion sizes by 1/3. 7.  Keep a food diary to help identify areas for improvement and promote awareness of bad habits. 8.  Increase your activity.  Choose something you like to do that is fun for you so you are more likely to stick with it. 9.  Do not forget your protein! 10.  Measure your neck, upper arms, waist, hips, and thighs and write those measurements down somewhere before you start your weight loss journey.  Changes in your measurements will tell a far more accurate story than the number on a scale. 11.  As you go, pay attention to how your clothes fit! 12.  Weigh yourself as often or as little as you need to.  Some folks do better weighing every day while others do better with once a week. 13.  Do not get discouraged!  Weight loss efforts are meant to be lifestyle changes.  Once you stop a medication (if you are taking 1), your behaviors and habits will determine if you maintain your weight loss or not.  For those on medications:  1.  Take your medication as prescribed every day first thing in the morning. 2.  Common side effects include nausea and constipation.  To combat this, increased your daily water consumption.  Consider adding in a stool softener (available OTC) if needed.  Also increase fiber intake with vegetables and fruits. 3.  If you have any side effects or concerns while taking medication, please do not hesitate to reach out to [place] here at the office. 4.  While on weight loss medications, we do require you to follow-up every 4 weeks with an in office visit.  Refills will not be called  in early on controlled substances.  Good luck on your weight loss journey!  Have faith in your self and you will reach your goals!

## 2021-07-23 NOTE — Progress Notes (Signed)
HPI with pertinent ROS:   CC: Transfer of care, follow-up on labs  HPI: Pleasant 45 year old female presenting today for transfer care to a new PCP and to discuss the following:  Sleep study-recently completed the sleep study with sleep medicine and was found to have mild sleep apnea.  The report recommended conservative measures but also noted that CPAP would be an option.  Reports that she sleeps fairly well and does not have excessive daytime sleepiness but she does snore.  Interested in conservative measures such as weight loss.  Weight concerns-currently taking metformin as well as Wellbutrin but these medications have not seem to help her appetite and she has continued to gain weight.  She has several family members and friends that are on [facility] and is very interested and the medication to see if she would qualify for it.  She is not currently exercising.  Avoid snacking and does eat 3 meals per day.  Did do healthy weight and wellness but reports that she "failed" it.  Hyperlipidemia-currently waiting on prior Auth for Lovaza and has not heard anything back.  Recently had her labs checked and her cholesterol is significantly high.  She was previously treated with a statin, Lipitor but had significant leg pains.  Did not try any other statins.  Is open to trying other options to help control her cholesterol.  Mood disorder-taking Wellbutrin 150 mg daily, tolerating well without side effects.  Feels this medication works well for her.  Prediabetes-taking metformin 500 mg daily with breakfast.  Has not noticed much of a change in her appetite although her recent A1c looks much better.  I reviewed the past medical history, family history, social history, surgical history, and allergies today and no changes were needed.  Please see the problem list section below in epic for further details.   Physical exam:   General: Well Developed, well nourished, and in no acute distress.  Neuro:  Alert and oriented x3.  HEENT: Normocephalic, atraumatic.  Skin: Warm and dry. Cardiac: Regular rate and rhythm, no murmurs rubs or gallops, no lower extremity edema.  Respiratory: Clear to auscultation bilaterally. Not using accessory muscles, speaking in full sentences.  Impression and Recommendations:    1. Encounter to establish care Reviewed available information and discussed care concerns with patient.   2. Familial hypercholesterolemia 3. Hypertriglyceridemia Continue with plan for Lovaza 2 g twice daily, following up on prior Auth to see where we stand.  Discussed various options for treatment with statins.  Starting Crestor 10 mg daily to see if she tolerates this a bit better.  4. Elevated hemoglobin A1c Hemoglobin A1c checked last week 5.5%.  Continue metformin as prescribed.  5. OSA (obstructive sleep apnea) AHI of 6.1 indicating mild sleep apnea with no significant impact on daily activities.  Recommend weight loss and conservative measures first.  If these are not helpful or if she would like to go ahead with a CPAP, she will let me know.  6. Vitamin D deficiency Continue vitamin D supplementation.  7. Need for Tdap vaccination Tdap given in office today. - Tdap vaccine greater than or equal to 7yo IM  8. Weight gain Discussed various options for weight loss medications as well as recommendations for lifestyle modification.  Discussed monitoring macros and weighing foods as well as incorporating resistance/strength training several times weekly.  Weight loss tips and tricks provided with AVS.  Discussed off label use of Mounjaro as well as side effects, risks, and benefits of the medication.  She would still like to proceed so sending in Mounjaro 2.5 mg weekly x4 weeks.  9. Mood disorder (HCC) Continue Wellbutrin 150 mg daily.  Return in about 4 weeks (around 08/20/2021) for weight check. ___________________________________________ Clearnce Sorrel, DNP, APRN,  FNP-BC [facility] and [place]

## 2021-07-24 ENCOUNTER — Other Ambulatory Visit (HOSPITAL_COMMUNITY): Payer: Self-pay

## 2021-07-25 ENCOUNTER — Other Ambulatory Visit (HOSPITAL_COMMUNITY): Payer: Self-pay

## 2021-08-04 ENCOUNTER — Encounter: Payer: Self-pay | Admitting: Medical-Surgical

## 2021-08-04 ENCOUNTER — Other Ambulatory Visit (HOSPITAL_COMMUNITY): Payer: Self-pay

## 2021-08-07 ENCOUNTER — Telehealth: Payer: Self-pay

## 2021-08-07 NOTE — Telephone Encounter (Signed)
Medication: omega-3 acid ethyl esters (LOVAZA) 1 g capsule Prior authorization submitted via CoverMyMeds on 08/07/2021 [place] submission pending

## 2021-08-14 ENCOUNTER — Encounter: Payer: Self-pay | Admitting: Medical-Surgical

## 2021-08-14 NOTE — Telephone Encounter (Signed)
Medication: omega-3 acid ethyl esters (LOVAZA) 1 g capsule Prior authorization determination received Medication has been denied Reason for denial:  "did not meet our step therapy rule for the requested drug. Our guideline named STANDARD STEP THERAPY requires that you have tried preferred options before receiving coverage for this drug. In order for your request to be approved, your provider needs to tell us that you have tried the step therapies listed below. Your provider may give a reason why you cannot take our suggested step therapies, including a statement that these therapies would not work as well or could cause side effects. In some cases, the requested medication or alternatives offered may have additional approval requirements. Approval requires you to try: generic fenofibrate (such as fenofibrate 145mg  tablets or fenofibrate 134mg  capsules).  Your doctor told us that you have a diagnosis of hyperlipidemia (high cholesterol) and hypertriglyceridemia (a high level of a certain type of fat in the blood). This request has been denied because we do not have information showing you have tried the required step therapy medication listed above to treat your condition."

## 2021-08-19 ENCOUNTER — Other Ambulatory Visit (HOSPITAL_COMMUNITY): Payer: Self-pay

## 2021-08-19 MED ORDER — FENOFIBRATE 145 MG PO TABS
145.0000 mg | ORAL_TABLET | Freq: Every day | ORAL | 3 refills | Status: DC
  Filled 2021-08-19: qty 90, 90d supply, fill #0
  Filled 2021-11-23: qty 90, 90d supply, fill #1
  Filled 2022-03-10: qty 90, 90d supply, fill #2
  Filled 2022-06-29: qty 90, 90d supply, fill #3

## 2021-08-20 ENCOUNTER — Other Ambulatory Visit (HOSPITAL_COMMUNITY): Payer: Self-pay

## 2021-08-20 ENCOUNTER — Encounter: Payer: Self-pay | Admitting: Medical-Surgical

## 2021-08-20 ENCOUNTER — Ambulatory Visit (INDEPENDENT_AMBULATORY_CARE_PROVIDER_SITE_OTHER): Payer: No Typology Code available for payment source | Admitting: Medical-Surgical

## 2021-08-20 ENCOUNTER — Other Ambulatory Visit: Payer: Self-pay

## 2021-08-20 VITALS — BP 115/73 | HR 65 | Resp 20 | Ht 65.0 in | Wt 187.0 lb

## 2021-08-20 DIAGNOSIS — Z7689 Persons encountering health services in other specified circumstances: Secondary | ICD-10-CM

## 2021-08-20 DIAGNOSIS — R635 Abnormal weight gain: Secondary | ICD-10-CM

## 2021-08-20 DIAGNOSIS — E6609 Other obesity due to excess calories: Secondary | ICD-10-CM

## 2021-08-20 MED ORDER — TIRZEPATIDE 5 MG/0.5ML ~~LOC~~ SOAJ
5.0000 mg | SUBCUTANEOUS | 0 refills | Status: DC
  Filled 2021-08-20: qty 2, 28d supply, fill #0

## 2021-08-20 NOTE — Progress Notes (Signed)
  HPI with pertinent ROS:   CC: Weight check  HPI: Very pleasant 45 year old female presenting today for weight check on Mounjaro 2.5 mg weekly for the last 4 weeks.  She has tolerated the medication well without significant side effects.  Notes that she did have a little constipation but Colace taken as needed helped with that.  She is working to increase her exercise and trying to fit in activity during her very busy day.  Has made significant dietary changes.  Feels that her appetite is decreased and she is often making conscious efforts to eat 3 meals per day.  She has not been snacking and has had no significant cravings.  Notes that she has been eating on the "fat" meal plan that she did previously, high-protein, low-carb.  She has not been weighing herself at home as she does not want to get discouraged with weight fluctuations.  I reviewed the past medical history, family history, social history, surgical history, and allergies today and no changes were needed.  Please see the problem list section below in epic for further details.  Physical exam:   General: Well Developed, well nourished, and in no acute distress.  Neuro: Alert and oriented x3.  HEENT: Normocephalic, atraumatic.  Skin: Warm and dry. Cardiac: Regular rate and rhythm.  Respiratory: Not using accessory muscles, speaking in full sentences.  Impression and Recommendations:    1. Encounter for weight management Over the last 4 weeks, has had a 6.5 pound weight loss.  Doing extremely well on lifestyle modifications and dietary changes.  Continue Mounjaro but increase to the 5 mg dose for the next 4 weeks.  Colace for constipation as needed.  Return in about 4 weeks (around 09/17/2021) for weight check. ___________________________________________ Clearnce Sorrel, DNP, APRN, FNP-BC [facility] and [place]

## 2021-09-12 ENCOUNTER — Encounter: Payer: Self-pay | Admitting: Medical-Surgical

## 2021-09-12 ENCOUNTER — Other Ambulatory Visit: Payer: Self-pay | Admitting: Medical-Surgical

## 2021-09-12 DIAGNOSIS — E7801 Familial hypercholesterolemia: Secondary | ICD-10-CM

## 2021-09-12 DIAGNOSIS — E781 Pure hyperglyceridemia: Secondary | ICD-10-CM

## 2021-09-25 ENCOUNTER — Other Ambulatory Visit (HOSPITAL_COMMUNITY): Payer: Self-pay

## 2021-09-25 ENCOUNTER — Other Ambulatory Visit: Payer: Self-pay

## 2021-09-25 ENCOUNTER — Ambulatory Visit (INDEPENDENT_AMBULATORY_CARE_PROVIDER_SITE_OTHER): Payer: No Typology Code available for payment source | Admitting: Medical-Surgical

## 2021-09-25 ENCOUNTER — Encounter: Payer: Self-pay | Admitting: Medical-Surgical

## 2021-09-25 VITALS — BP 94/61 | HR 67 | Resp 20 | Ht 65.0 in | Wt 175.6 lb

## 2021-09-25 DIAGNOSIS — Z7689 Persons encountering health services in other specified circumstances: Secondary | ICD-10-CM

## 2021-09-25 DIAGNOSIS — E782 Mixed hyperlipidemia: Secondary | ICD-10-CM

## 2021-09-25 MED ORDER — TIRZEPATIDE 5 MG/0.5ML ~~LOC~~ SOAJ
5.0000 mg | SUBCUTANEOUS | 0 refills | Status: DC
  Filled 2021-09-25: qty 2, 28d supply, fill #0
  Filled 2021-10-24: qty 2, 28d supply, fill #1

## 2021-09-25 NOTE — Progress Notes (Signed)
°  HPI with pertinent ROS:   CC: weight management, HLD follow up  HPI: Very pleasant 45 year old female presenting today for weight check on Mounjaro.  She has been using 5 mg weekly, tolerating well without side effects.  She is also on metformin 500 mg daily with breakfast.  She reports running out of the [facility] administered injection last week while waiting for follow-up.  She has not been exercising but has been following a healthy weight and wellness diet which is comprised of increased protein, lower carbs, and increased fruits and vegetables.  Notes that she is experiencing a decrease in appetite and often has to remind herself to eat.  I reviewed the past medical history, family history, social history, surgical history, and allergies today and no changes were needed.  Please see the problem list section below in epic for further details.   Physical exam:   General: Well Developed, well nourished, and in no acute distress.  Neuro: Alert and oriented x3.  HEENT: Normocephalic, atraumatic.  Skin: Warm and dry. Cardiac: Regular rate and rhythm, no murmurs rubs or gallops, no lower extremity edema.  Respiratory: Clear to auscultation bilaterally. Not using accessory muscles, speaking in full sentences.  Impression and Recommendations:    1. Mixed hyperlipidemia Continue Crestor 10 mg daily and Lovaza 2 g twice daily.  Having liver function and cholesterol rechecked today.  2. Encounter for weight management She has done extremely well on Mounjaro even at low doses.  Total of approximately 18 pounds lost in 8 weeks.  Since she is having such good results and already has significant appetite suppression, we will stay at the 5 mg weekly dose.  Continue metformin as prescribed.  Discussed reaching a goal weight of 140 pounds.  At that point, we will likely do a slow taper off of the medication to prevent rebound issues.  Patient verbalized understanding and is agreeable to the  plan.  Return in about 3 months (around 12/24/2021) for weight check. ___________________________________________ Clearnce Sorrel, DNP, APRN, FNP-BC [facility] and [place]

## 2021-09-26 ENCOUNTER — Encounter: Payer: Self-pay | Admitting: Medical-Surgical

## 2021-09-26 LAB — HEPATIC FUNCTION PANEL
AG Ratio: 1.6 (calc) (ref 1.0–2.5)
ALT: 11 U/L (ref 6–29)
AST: 11 U/L (ref 10–30)
Albumin: 4.2 g/dL (ref 3.6–5.1)
Alkaline phosphatase (APISO): 38 U/L (ref 31–125)
Bilirubin, Direct: 0.1 mg/dL (ref 0.0–0.2)
Globulin: 2.6 g/dL (calc) (ref 1.9–3.7)
Indirect Bilirubin: 0.3 mg/dL (calc) (ref 0.2–1.2)
Total Bilirubin: 0.4 mg/dL (ref 0.2–1.2)
Total Protein: 6.8 g/dL (ref 6.1–8.1)

## 2021-09-26 LAB — LIPID PANEL
Cholesterol: 129 mg/dL (ref <200)
HDL: 39 mg/dL — ABNORMAL LOW (ref >=50)
LDL Cholesterol (Calc): 70 mg/dL (calc)
Non-HDL Cholesterol (Calc): 90 mg/dL (calc) (ref <130)
Total CHOL/HDL Ratio: 3.3 (calc) (ref <5.0)
Triglycerides: 122 mg/dL (ref <150)

## 2021-10-24 ENCOUNTER — Other Ambulatory Visit (HOSPITAL_COMMUNITY): Payer: Self-pay

## 2021-10-27 ENCOUNTER — Other Ambulatory Visit (HOSPITAL_COMMUNITY): Payer: Self-pay

## 2021-11-12 ENCOUNTER — Other Ambulatory Visit: Payer: Self-pay | Admitting: Medical-Surgical

## 2021-11-12 ENCOUNTER — Other Ambulatory Visit (HOSPITAL_COMMUNITY): Payer: Self-pay

## 2021-11-12 ENCOUNTER — Encounter: Payer: Self-pay | Admitting: Medical-Surgical

## 2021-11-12 MED ORDER — TIRZEPATIDE 7.5 MG/0.5ML ~~LOC~~ SOAJ
7.5000 mg | SUBCUTANEOUS | 1 refills | Status: DC
  Filled 2021-11-12: qty 2, 28d supply, fill #0

## 2021-11-24 ENCOUNTER — Other Ambulatory Visit (HOSPITAL_COMMUNITY): Payer: Self-pay

## 2021-12-04 ENCOUNTER — Other Ambulatory Visit: Payer: Self-pay | Admitting: Medical-Surgical

## 2021-12-04 ENCOUNTER — Other Ambulatory Visit (HOSPITAL_COMMUNITY): Payer: Self-pay

## 2021-12-04 MED ORDER — TIRZEPATIDE 5 MG/0.5ML ~~LOC~~ SOAJ
5.0000 mg | SUBCUTANEOUS | 0 refills | Status: DC
Start: 2021-12-04 — End: 2022-03-25
  Filled 2021-12-04: qty 6, 84d supply, fill #0

## 2021-12-24 ENCOUNTER — Ambulatory Visit: Payer: No Typology Code available for payment source | Admitting: Medical-Surgical

## 2021-12-30 ENCOUNTER — Other Ambulatory Visit (HOSPITAL_COMMUNITY): Payer: Self-pay

## 2022-01-09 ENCOUNTER — Telehealth: Payer: Self-pay

## 2022-01-09 NOTE — Telephone Encounter (Addendum)
Initiated Prior authorization GIT:[id number]) 5 MG/0.5ML ?Via: Covermymeds ?Case/Key:[id number] ?Status: approved  as of 01/09/22 ?Reason:The authorization is effective for a maximum of 12 fills from 01/09/2022 to 01/09/2023 ?Notified Pt via: Mychart ?

## 2022-01-12 ENCOUNTER — Other Ambulatory Visit (HOSPITAL_COMMUNITY): Payer: Self-pay

## 2022-02-04 ENCOUNTER — Encounter: Payer: Self-pay | Admitting: Medical-Surgical

## 2022-02-04 ENCOUNTER — Ambulatory Visit (INDEPENDENT_AMBULATORY_CARE_PROVIDER_SITE_OTHER): Payer: No Typology Code available for payment source | Admitting: Medical-Surgical

## 2022-02-04 VITALS — BP 101/68 | HR 83 | Resp 20 | Ht 65.0 in | Wt 158.5 lb

## 2022-02-04 DIAGNOSIS — Z1211 Encounter for screening for malignant neoplasm of colon: Secondary | ICD-10-CM | POA: Diagnosis not present

## 2022-02-04 DIAGNOSIS — Z7689 Persons encountering health services in other specified circumstances: Secondary | ICD-10-CM | POA: Diagnosis not present

## 2022-02-04 DIAGNOSIS — R1013 Epigastric pain: Secondary | ICD-10-CM | POA: Diagnosis not present

## 2022-02-04 NOTE — Progress Notes (Signed)
?  HPI with pertinent ROS:  ? ?CC: weight check ? ?HPI: ?Pleasant 46 year old female presenting today for weight check on Mounjaro.  She has been using Mounjaro 5 mg injections weekly, tolerating well without side effects.  She originally had some increased constipation however she has increased her water intake and that helped tremendously.  She and her partner have been doing regular intentional exercise as well as changing their diet.  Feels that the medication has definitely helped with the cravings and she has been able to stick with their new lifestyle.  Has lost approximately 20 pounds since December and is very happy with her progress.  Goal weight of 140 pounds. ? ?Over the past 6 months, she has had issues with food feeling like it stuck in her esophagus when she is eating.  Has also had periods of epigastric pain as well as a protrusion in the epigastric area that is about the size of a ping-pong ball.  She does have a family history of GI issues including hiatal hernias and would like to get this evaluated.  Since she is due for a colonoscopy, she would like to have a referral to GI for possible colonoscopy and endoscopy the same day. ? ?I reviewed the past medical history, family history, social history, surgical history, and allergies today and no changes were needed.  Please see the problem list section below in epic for further details. ? ? ?Physical exam:  ? ?General: Well Developed, well nourished, and in no acute distress.  ?Neuro: Alert and oriented x3.  ?HEENT: Normocephalic, atraumatic.  ?Skin: Warm and dry. ?Cardiac: Regular rate and rhythm, no murmurs rubs or gallops, no lower extremity edema.  ?Respiratory: Clear to auscultation bilaterally. Not using accessory muscles, speaking in full sentences. ? ?Impression and Recommendations:   ? ?1. Encounter for weight management ?She has done extremely well on Mounjaro 5 mg daily.  This has been approved by her insurance for another year of treatment  so we will continue at the 5 mg dose. ? ?2. Epigastric pain ?3. Colon cancer screening ?Unclear etiology.  Possible hiatal hernia versus reflux.  Referring to GI for further evaluation per patient request.  She is 56 now so she is due for colon cancer screening and would like to have everything done at the same time. ?- Ambulatory referral to Gastroenterology ? ?Return in about 6 months (around 08/06/2022) for weight check. ?___________________________________________ ?Clearnce Sorrel, DNP, APRN, FNP-BC ?Primary Care and Sports Medicine ?[place] ?

## 2022-02-05 ENCOUNTER — Other Ambulatory Visit: Payer: Self-pay | Admitting: Medical-Surgical

## 2022-02-05 DIAGNOSIS — H9190 Unspecified hearing loss, unspecified ear: Secondary | ICD-10-CM

## 2022-02-11 ENCOUNTER — Other Ambulatory Visit (HOSPITAL_COMMUNITY): Payer: Self-pay

## 2022-02-12 ENCOUNTER — Other Ambulatory Visit (HOSPITAL_COMMUNITY): Payer: Self-pay

## 2022-02-16 ENCOUNTER — Other Ambulatory Visit (HOSPITAL_COMMUNITY): Payer: Self-pay

## 2022-02-20 ENCOUNTER — Ambulatory Visit: Payer: No Typology Code available for payment source | Attending: Audiologist | Admitting: Audiologist

## 2022-02-20 DIAGNOSIS — H903 Sensorineural hearing loss, bilateral: Secondary | ICD-10-CM | POA: Diagnosis present

## 2022-02-20 NOTE — Procedures (Signed)
?  Outpatient Audiology and [place] ?[address] ?[place], [place]  [address] ?[phone] ? ?AUDIOLOGICAL  EVALUATION ? ?NAME: Denise Strong     ?DOB:   December 28, 1975      ?MRN: [id number]                                                                                     ?DATE: 02/20/2022     ?REFERENT: [name], NP ?STATUS: Outpatient ?DIAGNOSIS: Mild Sensorineural Hering Loss Bilaterally   ? ?History: ?Denise Strong was seen for an audiological evaluation due to increased difficulty hearing. Denise Strong was last seen in July of 2021 and had a mild hearing loss in the mid pitches for both ears. Since that hearing test she feels her hearing has decreased. She is having lots of difficulty understanding people. She cannot hear the words to songs during church. She wants to make sure her hearing has not changed. She denies any medical history changes since the last hearing test. She has no pain or pressure in either ear. No other case history reported.  ? ?Evaluation:  ?Otoscopy showed a clear view of the tympanic membranes, bilaterally ?Tympanometry results were consistent with normal middle ear function bilaterally   ?Audiometric testing was completed using Conventional Audiometry techniques with insert earphones and TDH headphones. Test results are consistent with stable hearing, thresholds show mild hearing loss at 1-1.5kHz only in each ear. Speech Recognition Thresholds were obtained at 20dB HL in the right ear and at 20dB HL in the left ear which is an improvement from the last test. Word Recognition Testing was completed at 60 dB HL and Denise Strong scored 100% in each ear. No change in hearing.  ?QuickSIN performed bilaterally, showed SNR of 6dB. This is consistent with a mild loss and is worse than her last speech in noise test. Denise Strong is having increased difficulty hearing in noise.   ? ?Results:  ?The test results were reviewed with Denise Strong. Denise Strong is having increased difficulty hearing in noise but her  hearing is stable. Her hearing has not changed at all. The increased difficulty understanding speech in in background noise can be indicative of fatigue or stress. Denise Strong reported understanding and had no further questions.  ? ?Recommendations: ?1.   No further testing is recommended at this time. If speech/language delays or hearing difficulties are observed further audiological testing is recommended.       ? ? 25 minutes spent testing and counseling on results.  ? ?If you have any questions please feel free to contact me at [phone]. ? ?Alfonse Alpers ?Audiologist, Au.D., CCC-A ?02/20/2022  11:21 AM ? ?Cc: [name], NP ? ?

## 2022-03-10 ENCOUNTER — Other Ambulatory Visit (HOSPITAL_COMMUNITY): Payer: Self-pay

## 2022-03-17 ENCOUNTER — Other Ambulatory Visit (HOSPITAL_COMMUNITY): Payer: Self-pay

## 2022-03-23 ENCOUNTER — Other Ambulatory Visit: Payer: Self-pay | Admitting: Medical-Surgical

## 2022-03-24 ENCOUNTER — Other Ambulatory Visit (HOSPITAL_COMMUNITY): Payer: Self-pay

## 2022-03-25 ENCOUNTER — Encounter: Payer: Self-pay | Admitting: Medical-Surgical

## 2022-03-25 ENCOUNTER — Other Ambulatory Visit (HOSPITAL_COMMUNITY): Payer: Self-pay

## 2022-03-25 MED ORDER — TIRZEPATIDE 5 MG/0.5ML ~~LOC~~ SOAJ
5.0000 mg | SUBCUTANEOUS | 1 refills | Status: DC
  Filled 2022-03-25: qty 6, 84d supply, fill #0
  Filled 2022-06-29: qty 6, 84d supply, fill #1

## 2022-03-25 MED ORDER — TIRZEPATIDE 5 MG/0.5ML ~~LOC~~ SOAJ
5.0000 mg | SUBCUTANEOUS | 1 refills | Status: DC
Start: 2022-03-25 — End: 2022-03-25
  Filled 2022-03-25: qty 6, 84d supply, fill #0

## 2022-04-16 ENCOUNTER — Encounter: Payer: Self-pay | Admitting: Medical-Surgical

## 2022-04-17 ENCOUNTER — Encounter: Payer: Self-pay | Admitting: Internal Medicine

## 2022-05-20 ENCOUNTER — Encounter (INDEPENDENT_AMBULATORY_CARE_PROVIDER_SITE_OTHER): Payer: Self-pay

## 2022-05-22 ENCOUNTER — Other Ambulatory Visit (HOSPITAL_COMMUNITY): Payer: Self-pay

## 2022-06-09 ENCOUNTER — Other Ambulatory Visit (HOSPITAL_COMMUNITY): Payer: Self-pay

## 2022-06-09 ENCOUNTER — Encounter: Payer: Self-pay | Admitting: Internal Medicine

## 2022-06-09 ENCOUNTER — Ambulatory Visit (INDEPENDENT_AMBULATORY_CARE_PROVIDER_SITE_OTHER): Payer: No Typology Code available for payment source | Admitting: Internal Medicine

## 2022-06-09 VITALS — BP 110/86 | HR 67 | Ht 65.0 in | Wt 161.4 lb

## 2022-06-09 DIAGNOSIS — R1013 Epigastric pain: Secondary | ICD-10-CM

## 2022-06-09 DIAGNOSIS — R110 Nausea: Secondary | ICD-10-CM

## 2022-06-09 DIAGNOSIS — Z1211 Encounter for screening for malignant neoplasm of colon: Secondary | ICD-10-CM

## 2022-06-09 DIAGNOSIS — Z1212 Encounter for screening for malignant neoplasm of rectum: Secondary | ICD-10-CM

## 2022-06-09 MED ORDER — NA SULFATE-K SULFATE-MG SULF 17.5-3.13-1.6 GM/177ML PO SOLN
1.0000 | Freq: Once | ORAL | 0 refills | Status: AC
  Filled 2022-06-09: qty 354, 1d supply, fill #0

## 2022-06-09 NOTE — Progress Notes (Signed)
Patient ID: Denise Strong, female   DOB: 1976-07-19, 46 y.o.   MRN: [id number] HPI: Denise Strong is a 46 year old female with a history of gallstones status post cholecystectomy, obesity having started [facility] in November 2022 with resultant 40 pound weight loss to date, hyperlipidemia, history of kidney stones who is seen to evaluate upper abdominal pain and to consider colon cancer screening.  She is here alone today.  She reports that she has developed mid epigastric discomfort which radiates into the left upper abdomen and occasionally across the whole upper abdomen.  At times she feels a bulge in the upper abdomen which is tender to touch and palpable.  Seem to start around the springtime of 2023.  When it occurs it can be very uncomfortable and she will get up, walk around or stand up to try to help the pain get better.  Happens several times a week.  Not really having heartburn.  Does have some nausea in response to the pain but no vomiting.  Occasionally if she eats fast she will feel like food does not go down normally.  Bowel movements have been slightly less frequent with more noticeable constipation since starting Mounjaro.  She is currently treating this with increased fiber in her diet by using Metamucil and 100 mg of docusate daily.  Occasionally she will have overflow loose stools after passing a hard stool.  No blood in stool or melena.  Family history notable for a father with colon polyps but also intestinal angioectasias requiring ablation and IV iron.  She is married.  She has 41 twin 69-year-old children, 1 boy and 1 girl.  Never a tobacco user.  No alcohol on a daily basis.  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.  No prior endoscopic evaluations  No current acid suppression therapy.  It should be noted that her Mounjaro dose was increased to 5 mg weekly around the same time as her upper abdominal  symptoms.  Past Medical History:  Diagnosis Date   Anxiety    Chest pain 02/20/2016   Constipation    Elevated fasting blood sugar    Gallstones    Hyperlipidemia 02/20/2016   Kidney stones    Multiple food allergies    Obesity    Palpitations 02/20/2016   Renal disorder     Past Surgical History:  Procedure Laterality Date   APPENDECTOMY     CHOLECYSTECTOMY      Outpatient Medications Prior to Visit  Medication Sig Dispense Refill   buPROPion (WELLBUTRIN XL) 150 MG 24 hr tablet Take 1 tablet (150 mg total) by mouth every morning. 90 tablet 3   fenofibrate (TRICOR) 145 MG tablet Take 1 tablet (145 mg total) by mouth daily. 90 tablet 3   metFORMIN (GLUCOPHAGE-XR) 500 MG 24 hr tablet Take 1 tablet (500 mg total) by mouth daily with breakfast. 90 tablet 3   rosuvastatin (CRESTOR) 10 MG tablet Take 1 tablet (10 mg total) by mouth daily. 90 tablet 3   tirzepatide (MOUNJARO) 5 MG/0.5ML Pen Inject 5 mg into the skin once a week. 6 mL 1   No facility-administered medications prior to visit.    Allergies  Allergen Reactions   Ceclor [Cefaclor]    Erythromycin    Iodine    Penicillins     Family History  Problem Relation Age of Onset   Diabetes Mother    Hypertension Father  Stroke Father    Heart disease Father    Sleep apnea Father    Colon polyps Father    Diabetes Brother    Diabetes Maternal Grandmother    AAA (abdominal aortic aneurysm) Maternal Grandfather    Dementia Paternal Grandfather     Social History   Tobacco Use   Smoking status: Former    Types: Cigarettes    Quit date: 02/20/1995    Years since quitting: 27.3   Smokeless tobacco: Never   Tobacco comments:    social; in college  Vaping Use   Vaping Use: Never used  Substance Use Topics   Alcohol use: No    Alcohol/week: 0.0 standard drinks of alcohol   Drug use: No    ROS: As per history of present illness, otherwise negative  BP 110/86   Pulse 67   Ht '5\' 5"'$  (1.651 m)   Wt 161 lb  6 oz (73.2 kg)   BMI 26.85 kg/m  Constitutional: Well-developed and well-nourished. No distress. HEENT: Normocephalic and atraumatic.  No scleral icterus. Cardiovascular: Normal rate, regular rhythm and intact distal pulses. No M/R/G Pulmonary/chest: Effort normal and breath sounds normal. No wheezing, rales or rhonchi. Abdominal: Soft, mild tenderness in the epigastrium but also lower abdomen without rebound or guarding, nondistended. Bowel sounds active throughout. There are no masses palpable. No hepatosplenomegaly. Extremities: no clubbing, cyanosis, or edema Neurological: Alert and oriented to person place and time. Skin: Skin is warm and dry.  Psychiatric: Normal mood and affect. Behavior is normal.  RELEVANT LABS AND IMAGING: CBC    Component Value Date/Time   WBC 6.6 07/24/2020 1016   RBC 4.88 07/24/2020 1016   HGB 14.9 07/24/2020 1016   HCT 43.8 07/24/2020 1016   PLT 326 07/24/2020 1016   MCV 90 07/24/2020 1016   MCH 30.5 07/24/2020 1016   MCHC 34.0 07/24/2020 1016   RDW 12.7 07/24/2020 1016   LYMPHSABS 2.2 07/24/2020 1016   EOSABS 0.1 07/24/2020 1016   BASOSABS 0.1 07/24/2020 1016    CMP     Component Value Date/Time   NA 144 07/24/2020 1016   K 4.6 07/24/2020 1016   CL 104 07/24/2020 1016   CO2 25 07/24/2020 1016   GLUCOSE 91 07/24/2020 1016   GLUCOSE 85 02/20/2016 1143   BUN 10 07/24/2020 1016   CREATININE 0.60 07/24/2020 1016   CREATININE 0.56 02/20/2016 1143   CALCIUM 9.0 07/24/2020 1016   PROT 6.8 09/25/2021 0000   PROT 7.3 07/24/2020 1016   ALBUMIN 4.3 07/24/2020 1016   AST 11 09/25/2021 0000   ALT 11 09/25/2021 0000   ALKPHOS 67 07/24/2020 1016   BILITOT 0.4 09/25/2021 0000   BILITOT 0.3 07/24/2020 1016   GFRNONAA 112 07/24/2020 1016   GFRAA 129 07/24/2020 1016    ASSESSMENT/PLAN: 46 year old female with a history of gallstones status post cholecystectomy, obesity having started [facility] in November 2022 with resultant 40 pound weight loss to  date, hyperlipidemia, history of kidney stones who is seen to evaluate upper abdominal pain and to consider colon cancer screening.   Epigastric abdominal pain --symptoms are suspicious for dyspepsia and may be directly related to the higher dose of Mounjaro.  We discussed how this medication class can cause a gastroparesis type effect with decreased gastric emptying and gastric accommodation.  Does not sound like traditional GERD.  We discussed further evaluation with upper endoscopy -- EGD in the [place] -- Possible PPI trial depending on upper endoscopy findings  2.  Colon  cancer screening --average rescreening colonoscopy recommended at this time.  We reviewed the risk, benefits and alternatives and she is agreeable and wishes to proceed -- Colonoscopy in the [place] on same day as EGD  3.  Obesity --excellent response to [facility] today having lost about 40 pounds.  Her goal is to lose an additional 20 pounds.  We discussed how on Mounjaro may be causing some of the symptoms as discussed in #1, we would complete the evaluation and possibly try PPI with the hopes of not having to reduce Mounjaro dose given her positive response and desire to continue therapy.      IX:BOERQS, Caryl Asp, [place],  [place] [address]

## 2022-06-09 NOTE — Patient Instructions (Signed)
If you are age 45 or older, your body mass index should be between 23-30. Your Body mass index is 26.85 kg/m. If this is out of the aforementioned range listed, please consider follow up with your Primary Care Provider.  If you are age 18 or younger, your body mass index should be between 19-25. Your Body mass index is 26.85 kg/m. If this is out of the aformentioned range listed, please consider follow up with your Primary Care Provider.   You have been scheduled for an endoscopy and colonoscopy. Please follow the written instructions given to you at your visit today. Please pick up your prep supplies at the pharmacy within the next 1-3 days. If you use inhalers (even only as needed), please bring them with you on the day of your procedure.   The [facility] providers would like to encourage you to use [facility] to communicate with providers for non-urgent requests or questions.  Due to long hold times on the telephone, sending your provider a message by [facility] may be a faster and more efficient way to get a response.  Please allow 48 business hours for a response.  Please remember that this is for non-urgent requests.   It was a pleasure to see you today!  Thank you for trusting me with your gastrointestinal care!

## 2022-06-10 ENCOUNTER — Other Ambulatory Visit (HOSPITAL_COMMUNITY): Payer: Self-pay

## 2022-06-18 ENCOUNTER — Encounter: Payer: Self-pay | Admitting: Medical-Surgical

## 2022-06-29 ENCOUNTER — Other Ambulatory Visit (HOSPITAL_COMMUNITY): Payer: Self-pay

## 2022-07-15 ENCOUNTER — Encounter: Payer: Self-pay | Admitting: Internal Medicine

## 2022-07-18 ENCOUNTER — Encounter: Payer: Self-pay | Admitting: Certified Registered Nurse Anesthetist

## 2022-07-22 ENCOUNTER — Encounter: Payer: Self-pay | Admitting: Internal Medicine

## 2022-07-22 ENCOUNTER — Ambulatory Visit (AMBULATORY_SURGERY_CENTER): Payer: No Typology Code available for payment source | Admitting: Internal Medicine

## 2022-07-22 VITALS — BP 95/50 | HR 63 | Temp 98.6°F | Resp 12 | Ht 65.0 in | Wt 161.0 lb

## 2022-07-22 DIAGNOSIS — K229 Disease of esophagus, unspecified: Secondary | ICD-10-CM

## 2022-07-22 DIAGNOSIS — D122 Benign neoplasm of ascending colon: Secondary | ICD-10-CM

## 2022-07-22 DIAGNOSIS — K2950 Unspecified chronic gastritis without bleeding: Secondary | ICD-10-CM

## 2022-07-22 DIAGNOSIS — Z1212 Encounter for screening for malignant neoplasm of rectum: Secondary | ICD-10-CM | POA: Diagnosis not present

## 2022-07-22 DIAGNOSIS — K2970 Gastritis, unspecified, without bleeding: Secondary | ICD-10-CM

## 2022-07-22 DIAGNOSIS — R110 Nausea: Secondary | ICD-10-CM

## 2022-07-22 DIAGNOSIS — Z1211 Encounter for screening for malignant neoplasm of colon: Secondary | ICD-10-CM | POA: Diagnosis present

## 2022-07-22 DIAGNOSIS — K635 Polyp of colon: Secondary | ICD-10-CM | POA: Diagnosis not present

## 2022-07-22 DIAGNOSIS — R1013 Epigastric pain: Secondary | ICD-10-CM

## 2022-07-22 MED ORDER — SODIUM CHLORIDE 0.9 % IV SOLN: 500.0000 mL | Freq: Once | INTRAVENOUS | Status: DC

## 2022-07-22 NOTE — Progress Notes (Signed)
Vs by Cw  Pt's states no medical or surgical changes since previsit or office visit.

## 2022-07-22 NOTE — Progress Notes (Signed)
Called to room to assist during endoscopic procedure.  Patient ID and intended procedure confirmed with present staff. Received instructions for my participation in the procedure from the performing physician.  

## 2022-07-22 NOTE — Op Note (Signed)
Denise Strong: Denise Strong Procedure Date: 07/22/2022 10:33 AM MRN: [id number] Endoscopist: Jerene Bears , MD Age: 46 Referring MD:  Date of Birth: 04/09/1976 Gender: Female Account #: [id number] Procedure:                Upper GI endoscopy Indications:              Epigastric abdominal pain Medicines:                Monitored Anesthesia Care Procedure:                Pre-Anesthesia Assessment:                           - Prior to the procedure, a History and Physical                            was performed, and patient medications and                            allergies were reviewed. The patient's tolerance of                            previous anesthesia was also reviewed. The risks                            and benefits of the procedure and the sedation                            options and risks were discussed with the patient.                            All questions were answered, and informed consent                            was obtained. Prior Anticoagulants: The patient has                            taken no previous anticoagulant or antiplatelet                            agents. ASA Grade Assessment: II - A patient with                            mild systemic disease. After reviewing the risks                            and benefits, the patient was deemed in                            satisfactory condition to undergo the procedure.                           After obtaining informed consent, the endoscope was  passed under direct vision. Throughout the                            procedure, the patient's blood pressure, pulse, and                            oxygen saturations were monitored continuously. The                            Endoscope was introduced through the mouth, and                            advanced to the second part of duodenum. The upper                            GI endoscopy was accomplished  without difficulty.                            The patient tolerated the procedure well. Scope In: Scope Out: Findings:                 The Z-line was irregular and was found 37 to 38 cm                            from the incisors. Biopsies were taken with a cold                            forceps for histology to exclude Barrett's                            esophagus.                           Scattered minimal inflammation characterized by                            erosions and erythema was found in the gastric                            antrum. Biopsies were taken with a cold forceps for                            histology and Helicobacter pylori testing.                           The examined duodenum was normal. Complications:            No immediate complications. Estimated Blood Loss:     Estimated blood loss was minimal. Impression:               - Z-line irregular, 37 to 38 cm from the incisors.                            Biopsied.                           -  Mild gastritis. Biopsied.                           - Normal examined duodenum. Recommendation:           - Patient has a contact number available for                            emergencies. The signs and symptoms of potential                            delayed complications were discussed with the                            patient. Return to normal activities tomorrow.                            Written discharge instructions were provided to the                            patient.                           - Resume previous diet.                           - Continue present medications.                           - Await pathology results. Consider PPI trial for                            epigastric pain, but will await biopsy results. Jerene Bears, MD 07/22/2022 10:59:27 AM This report has been signed electronically.

## 2022-07-22 NOTE — Progress Notes (Signed)
GASTROENTEROLOGY PROCEDURE H&P NOTE   Primary Care Physician: [name], NP    Reason for Procedure:   Epigastric pain and colon cancer screening  Plan:    EGD/colonoscopy  Patient is appropriate for endoscopic procedure(s) in the ambulatory ([facility]) setting.  The nature of the procedure, as well as the risks, benefits, and alternatives were carefully and thoroughly reviewed with the patient. Ample time for discussion and questions allowed. The patient understood, was satisfied, and agreed to proceed.     HPI: Denise Strong is a 46 y.o. female who presents for EGD and colonoscopy.  Medical history as below.  Tolerated the prep.  No recent chest pain or shortness of breath.  No abdominal pain today.  Past Medical History:  Diagnosis Date   Anxiety    Chest pain 02/20/2016   Constipation    Elevated fasting blood sugar    Gallstones    Hyperlipidemia 02/20/2016   Kidney stones    Multiple food allergies    Obesity    Palpitations 02/20/2016   Renal disorder     Past Surgical History:  Procedure Laterality Date   APPENDECTOMY     CHOLECYSTECTOMY      Prior to Admission medications   Medication Sig Start Date End Date Taking? Authorizing Provider  buPROPion (WELLBUTRIN XL) 150 MG 24 hr tablet Take 1 tablet (150 mg total) by mouth every morning. 07/23/21  Yes [name], NP  fenofibrate (TRICOR) 145 MG tablet Take 1 tablet (145 mg total) by mouth daily. 08/19/21  Yes [name], NP  metFORMIN (GLUCOPHAGE-XR) 500 MG 24 hr tablet Take 1 tablet (500 mg total) by mouth daily with breakfast. 07/23/21  Yes [name], NP  rosuvastatin (CRESTOR) 10 MG tablet Take 1 tablet (10 mg total) by mouth daily. 07/23/21  Yes [name], NP  tirzepatide [facility]) 5 MG/0.5ML Pen Inject 5 mg into the skin once a week. 03/25/22   [name], NP    Current Outpatient Medications  Medication Sig Dispense Refill   buPROPion (WELLBUTRIN XL) 150 MG 24 hr tablet Take 1 tablet (150 mg  total) by mouth every morning. 90 tablet 3   fenofibrate (TRICOR) 145 MG tablet Take 1 tablet (145 mg total) by mouth daily. 90 tablet 3   metFORMIN (GLUCOPHAGE-XR) 500 MG 24 hr tablet Take 1 tablet (500 mg total) by mouth daily with breakfast. 90 tablet 3   rosuvastatin (CRESTOR) 10 MG tablet Take 1 tablet (10 mg total) by mouth daily. 90 tablet 3   tirzepatide (MOUNJARO) 5 MG/0.5ML Pen Inject 5 mg into the skin once a week. 6 mL 1   Current Facility-Administered Medications  Medication Dose Route Frequency Provider Last Rate Last Admin   0.9 %  sodium chloride infusion  500 mL Intravenous Once Yvett Rossel, Lajuan Lines, MD        Allergies as of 07/22/2022 - Review Complete 07/22/2022  Allergen Reaction Noted   Ceclor [cefaclor]  03/11/2013   Erythromycin  03/11/2013   Iodine  03/11/2013   Penicillins  03/11/2013    Family History  Problem Relation Age of Onset   Diabetes Mother    Hypertension Father    Stroke Father    Heart disease Father    Sleep apnea Father    Colon polyps Father    Diabetes Brother    Diabetes Maternal Grandmother    AAA (abdominal aortic aneurysm) Maternal Grandfather    Dementia Paternal Grandfather     Social History   Socioeconomic  History   Marital status: Married    Spouse name: Not on file   Number of children: Not on file   Years of education: Not on file   Highest education level: Not on file  Occupational History   Not on file  Tobacco Use   Smoking status: Former    Types: Cigarettes    Quit date: 02/20/1995    Years since quitting: 27.4   Smokeless tobacco: Never   Tobacco comments:    social; in college  Vaping Use   Vaping Use: Never used  Substance and Sexual Activity   Alcohol use: No    Alcohol/week: 0.0 standard drinks of alcohol   Drug use: No   Sexual activity: Not on file  Other Topics Concern   Not on file  Social History Narrative   Not on file   Social Determinants of Health   Financial Resource Strain: Not on file   Food Insecurity: Not on file  Transportation Needs: Not on file  Physical Activity: Not on file  Stress: Not on file  Social Connections: Not on file  Intimate Partner Violence: Not on file    Physical Exam: Vital signs in last 24 hours: '@BP'$  (!) 98/45   Pulse 71   Temp 98.6 F (37 C) (Skin)   Ht '5\' 5"'$  (1.651 m)   Wt 161 lb (73 kg)   SpO2 100%   BMI 26.79 kg/m  GEN: NAD EYE: Sclerae anicteric ENT: MMM CV: Non-tachycardic Pulm: CTA b/l GI: Soft, NT/ND NEURO:  Alert & Oriented x 3   Zenovia Jarred, MD [facility]  07/22/2022 10:26 AM

## 2022-07-22 NOTE — Op Note (Signed)
[place] Patient Name: Denise Strong Procedure Date: 07/22/2022 10:20 AM MRN: [id number] Endoscopist: Jerene Bears , MD Age: 46 Referring MD:  Date of Birth: [birth year] Gender: Female Account #: [id number] Procedure:                Colonoscopy Indications:              Screening for colorectal malignant neoplasm, This                            is the patient's first colonoscopy Medicines:                Monitored Anesthesia Care Procedure:                Pre-Anesthesia Assessment:                           - Prior to the procedure, a History and Physical                            was performed, and patient medications and                            allergies were reviewed. The patient's tolerance of                            previous anesthesia was also reviewed. The risks                            and benefits of the procedure and the sedation                            options and risks were discussed with the patient.                            All questions were answered, and informed consent                            was obtained. Prior Anticoagulants: The patient has                            taken no previous anticoagulant or antiplatelet                            agents. ASA Grade Assessment: II - A patient with                            mild systemic disease. After reviewing the risks                            and benefits, the patient was deemed in                            satisfactory condition to undergo the procedure.  After obtaining informed consent, the colonoscope                            was passed under direct vision. Throughout the                            procedure, the patient's blood pressure, pulse, and                            oxygen saturations were monitored continuously. The                            CF [id number] #[id number] was introduced through the anus                            and advanced to the  cecum, identified by                            appendiceal orifice and ileocecal valve. The                            colonoscopy was performed without difficulty. The                            patient tolerated the procedure well. The quality                            of the bowel preparation was excellent. The                            ileocecal valve, appendiceal orifice, and rectum                            were photographed. Scope In: 10:43:45 AM Scope Out: 10:55:48 AM Scope Withdrawal Time: 0 hours 9 minutes 44 seconds  Total Procedure Duration: 0 hours 12 minutes 3 seconds  Findings:                 The digital rectal exam was normal.                           A 6 mm polyp was found in the ascending colon. The                            polyp was sessile. The polyp was removed with a                            cold snare. Resection and retrieval were complete.                           The exam was otherwise without abnormality on                            direct and retroflexion views. Complications:  No immediate complications. Estimated Blood Loss:     Estimated blood loss: none. Impression:               - One 6 mm polyp in the ascending colon, removed                            with a cold snare. Resected and retrieved.                           - The examination was otherwise normal on direct                            and retroflexion views. Recommendation:           - Patient has a contact number available for                            emergencies. The signs and symptoms of potential                            delayed complications were discussed with the                            patient. Return to normal activities tomorrow.                            Written discharge instructions were provided to the                            patient.                           - Resume previous diet.                           - Continue present medications.                            - Await pathology results.                           - Repeat colonoscopy is recommended. The                            colonoscopy date will be determined after pathology                            results from today's exam become available for                            review. Jerene Bears, MD 07/22/2022 11:01:07 AM This report has been signed electronically.

## 2022-07-22 NOTE — Progress Notes (Signed)
VSS, transported to PACU °

## 2022-07-22 NOTE — Patient Instructions (Signed)
Handouts provided on gastritis and polyps.   Continue present medications. Await pathology results. Consider PPI trial for epigastric pain, but will await biopsy results.   YOU HAD AN ENDOSCOPIC PROCEDURE TODAY AT [facility]:   Refer to the procedure report that was given to you for any specific questions about what was found during the examination.  If the procedure report does not answer your questions, please call your gastroenterologist to clarify.  If you requested that your care partner not be given the details of your procedure findings, then the procedure report has been included in a sealed envelope for you to review at your convenience later.  YOU SHOULD EXPECT: Some feelings of bloating in the abdomen. Passage of more gas than usual.  Walking can help get rid of the air that was put into your GI tract during the procedure and reduce the bloating. If you had a lower endoscopy (such as a colonoscopy or flexible sigmoidoscopy) you may notice spotting of blood in your stool or on the toilet paper. If you underwent a bowel prep for your procedure, you may not have a normal bowel movement for a few days.  Please Note:  You might notice some irritation and congestion in your nose or some drainage.  This is from the oxygen used during your procedure.  There is no need for concern and it should clear up in a day or so.  SYMPTOMS TO REPORT IMMEDIATELY:  Following lower endoscopy (colonoscopy or flexible sigmoidoscopy):  Excessive amounts of blood in the stool  Significant tenderness or worsening of abdominal pains  Swelling of the abdomen that is new, acute  Fever of 100F or higher  Following upper endoscopy (EGD)  Vomiting of blood or coffee ground material  New chest pain or pain under the shoulder blades  Painful or persistently difficult swallowing  New shortness of breath  Fever of 100F or higher  Black, tarry-looking stools  For urgent or emergent issues, a  gastroenterologist can be reached at any hour by calling [phone]. Do not use MyChart messaging for urgent concerns.    DIET:  We do recommend a small meal at first, but then you may proceed to your regular diet.  Drink plenty of fluids but you should avoid alcoholic beverages for 24 hours.  ACTIVITY:  You should plan to take it easy for the rest of today and you should NOT DRIVE or use heavy machinery until tomorrow (because of the sedation medicines used during the test).    FOLLOW UP: Our staff will call the number listed on your records the next business day following your procedure.  We will call around 7:15- 8:00 am to check on you and address any questions or concerns that you may have regarding the information given to you following your procedure. If we do not reach you, we will leave a message.     If any biopsies were taken you will be contacted by phone or by letter within the next 1-3 weeks.  Please call us at [phone] if you have not heard about the biopsies in 3 weeks.    SIGNATURES/CONFIDENTIALITY: You and/or your care partner have signed paperwork which will be entered into your electronic medical record.  These signatures attest to the fact that that the information above on your After Visit Summary has been reviewed and is understood.  Full responsibility of the confidentiality of this discharge information lies with you and/or your care-partner.

## 2022-07-23 ENCOUNTER — Telehealth: Payer: Self-pay | Admitting: *Deleted

## 2022-07-23 NOTE — Telephone Encounter (Signed)
  Follow up Call-     07/22/2022   10:02 AM  Call back number  Post procedure Call Back phone  # [phone]  Permission to leave phone message Yes     Patient questions:  Do you have a fever, pain , or abdominal swelling? No. Pain Score  0 *  Have you tolerated food without any problems? Yes.    Have you been able to return to your normal activities? Yes.    Do you have any questions about your discharge instructions: Diet   No. Medications  No. Follow up visit  No.  Do you have questions or concerns about your Care? No.  Actions: * If pain score is 4 or above: No action needed, pain <4.

## 2022-07-27 ENCOUNTER — Other Ambulatory Visit (HOSPITAL_COMMUNITY): Payer: Self-pay

## 2022-07-27 ENCOUNTER — Encounter: Payer: Self-pay | Admitting: Medical-Surgical

## 2022-07-27 ENCOUNTER — Other Ambulatory Visit: Payer: Self-pay

## 2022-07-27 ENCOUNTER — Encounter: Payer: Self-pay | Admitting: Internal Medicine

## 2022-07-27 MED ORDER — PANTOPRAZOLE SODIUM 40 MG PO TBEC
40.0000 mg | DELAYED_RELEASE_TABLET | Freq: Every day | ORAL | 3 refills | Status: DC
  Filled 2022-07-27: qty 30, 30d supply, fill #0
  Filled 2022-09-03: qty 30, 30d supply, fill #1
  Filled 2022-10-15: qty 30, 30d supply, fill #2
  Filled 2022-11-27: qty 30, 30d supply, fill #3

## 2022-08-06 ENCOUNTER — Encounter: Payer: Self-pay | Admitting: Medical-Surgical

## 2022-08-06 ENCOUNTER — Other Ambulatory Visit (HOSPITAL_COMMUNITY): Payer: Self-pay

## 2022-08-06 ENCOUNTER — Ambulatory Visit (INDEPENDENT_AMBULATORY_CARE_PROVIDER_SITE_OTHER): Payer: No Typology Code available for payment source | Admitting: Medical-Surgical

## 2022-08-06 VITALS — BP 96/61 | HR 73 | Resp 20 | Ht 65.0 in | Wt 155.9 lb

## 2022-08-06 DIAGNOSIS — E559 Vitamin D deficiency, unspecified: Secondary | ICD-10-CM

## 2022-08-06 DIAGNOSIS — E782 Mixed hyperlipidemia: Secondary | ICD-10-CM | POA: Diagnosis not present

## 2022-08-06 DIAGNOSIS — R7303 Prediabetes: Secondary | ICD-10-CM

## 2022-08-06 DIAGNOSIS — F39 Unspecified mood [affective] disorder: Secondary | ICD-10-CM

## 2022-08-06 DIAGNOSIS — Z789 Other specified health status: Secondary | ICD-10-CM | POA: Diagnosis not present

## 2022-08-06 DIAGNOSIS — Z7689 Persons encountering health services in other specified circumstances: Secondary | ICD-10-CM

## 2022-08-06 DIAGNOSIS — G4733 Obstructive sleep apnea (adult) (pediatric): Secondary | ICD-10-CM

## 2022-08-06 MED ORDER — MOUNJARO 7.5 MG/0.5ML ~~LOC~~ SOAJ
7.5000 mg | SUBCUTANEOUS | 1 refills | Status: DC
  Filled 2022-08-06: qty 6, 84d supply, fill #0
  Filled 2022-08-11: qty 2, 28d supply, fill #0
  Filled 2022-09-03: qty 2, 28d supply, fill #1
  Filled 2022-10-15: qty 2, 28d supply, fill #2
  Filled 2022-12-02: qty 2, 28d supply, fill #3
  Filled 2023-01-09: qty 2, 28d supply, fill #4

## 2022-08-06 MED ORDER — BUPROPION HCL ER (XL) 300 MG PO TB24
300.0000 mg | ORAL_TABLET | Freq: Every morning | ORAL | 3 refills | Status: DC
  Filled 2022-08-06: qty 90, 90d supply, fill #0
  Filled 2022-09-03 – 2023-02-18 (×2): qty 90, 90d supply, fill #1
  Filled 2023-05-25: qty 90, 90d supply, fill #2

## 2022-08-06 NOTE — Progress Notes (Signed)
Medical screening examination/treatment was performed by qualified nurse practitioner student and as supervising provider I was immediately available for consultation/collaboration. I have reviewed documentation and agree with assessment and plan. ° °Denise Strong L. Daleyza Gadomski, DNP, APRN, FNP-BC °[facility]  °Primary Care and Sports Medicine ° °

## 2022-08-06 NOTE — Progress Notes (Signed)
Established Patient Office Visit  Subjective   Patient ID: Denise Strong, female    DOB: 1975-12-24  Age: 46 y.o. MRN: [id number]  Chief Complaint  Patient presents with   Weight Check    HPI Weight concerns Medications: tirzapatide (mounjaro) 5 mg/0.5 ml ped and Metformin (glucophage-XR) 500 mg 24 hr tablet.  Gain/loss: weights 148 lbs at home, about a 40 lbs weight loss.  Poor sleep: denies any issue with sleep. Stress: Voiced increased some anxiety lately.  Diet: Eats well,  Weighing/logging foods: no. Exercising: yes  Type: [place], thread mill and weights  Frequency: Usually everyday,  Weighing at home: yes,  Weight loss measures tried: none mentioned Labs done: Ordered.  Hyperlipidemia Medication: Rosuvastatin (crestor) 20 mg, fenofibrate 145 mg.  Side effects:none stated Low fat diet: yes    Mood concerns Anxiety: Increased lately, Depression: none stated Medications: Wellbutrin 150 mg 24 hr tablet Compliant: yes Meds helpful: yes, inquiring about increase in dosage Side effects: none stated. Past medications: none stated  Counseling: none stated  SI/HI: none stated.      Review of Systems  Constitutional: Negative.   Respiratory: Negative.    Cardiovascular: Negative.   Gastrointestinal: Negative.   Musculoskeletal: Negative.   Skin: Negative.   Neurological: Negative.   Psychiatric/Behavioral: Negative.        Objective:     BP 96/61 (BP Location: Left Arm, Cuff Size: Normal)   Pulse 73   Resp 20   Ht '5\' 5"'$  (1.651 m)   Wt 70.7 kg   SpO2 98%   BMI 25.94 kg/m    Physical Exam Constitutional:      General: She is not in acute distress.    Appearance: Normal appearance. She is normal weight. She is not ill-appearing, toxic-appearing or diaphoretic.  HENT:     Head: Normocephalic and atraumatic.  Cardiovascular:     Rate and Rhythm: Normal rate and regular rhythm.     Pulses: Normal pulses.     Heart sounds: Normal heart sounds.   Pulmonary:     Effort: Pulmonary effort is normal.     Breath sounds: Normal breath sounds.  Skin:    General: Skin is warm and dry.  Neurological:     General: No focal deficit present.     Mental Status: She is alert and oriented to person, place, and time. Mental status is at baseline.  Psychiatric:        Mood and Affect: Mood normal.        Behavior: Behavior normal.        Thought Content: Thought content normal.        Judgment: Judgment normal.      No results found for any visits on 08/06/22.    The ASCVD Risk score (Arnett DK, et al., 2019) failed to calculate for the following reasons:   The valid total cholesterol range is 130 to 320 mg/dL    Assessment & Plan:   1. Mixed hyperlipidemia She is tolerating her medications well, so we will continue her Crestor 10 mg daily. Continue with eating healthy diet, low in saturated and trans fats. We also ordered some blood work to check her chemistries and lipid panel.  - COMPLETE METABOLIC PANEL WITH GFR - Lipid panel  2. Statin intolerance - severe myalgia w/ atorvastatin  See above.   3. Prediabetes We will obtain her A1C and check her levels, continue with good diet and active lifestyle.  - Hemoglobin A1c  4.  Vitamin D deficiency No concerns at this time but we will check her levels.  - VITAMIN D 25 Hydroxy (Vit-D Deficiency, Fractures)  5. Mood disorder (HCC) We will increase her bupropion from 150 mg to 300 mg 24 hr tab daily.   6. Encounter for weight management She is concern that there is a plateau in her weight loss journey, we will increase the dose of mounjaro to 7.5 mg daily.   7. OSA (obstructive sleep apnea) No problems voiced at this time, routine blood work  - CBC with Differential/Platelet    Return in about 6 months (around 02/05/2023) for chronic disease follow up.    Ralph Leyden, RN

## 2022-08-11 ENCOUNTER — Other Ambulatory Visit (HOSPITAL_COMMUNITY): Payer: Self-pay

## 2022-08-27 ENCOUNTER — Encounter: Payer: Self-pay | Admitting: Medical-Surgical

## 2022-08-27 ENCOUNTER — Encounter: Payer: Self-pay | Admitting: Internal Medicine

## 2022-09-03 ENCOUNTER — Other Ambulatory Visit: Payer: Self-pay | Admitting: Medical-Surgical

## 2022-09-04 ENCOUNTER — Other Ambulatory Visit (HOSPITAL_COMMUNITY): Payer: Self-pay

## 2022-09-07 ENCOUNTER — Other Ambulatory Visit (HOSPITAL_COMMUNITY): Payer: Self-pay

## 2022-09-07 MED ORDER — FENOFIBRATE 145 MG PO TABS
145.0000 mg | ORAL_TABLET | Freq: Every day | ORAL | 3 refills | Status: DC
  Filled 2022-09-07 – 2022-10-15 (×2): qty 90, 90d supply, fill #0
  Filled 2023-02-18: qty 90, 90d supply, fill #1
  Filled 2023-05-25: qty 90, 90d supply, fill #2
  Filled 2023-09-06: qty 90, 90d supply, fill #3

## 2022-09-07 MED ORDER — ROSUVASTATIN CALCIUM 10 MG PO TABS
10.0000 mg | ORAL_TABLET | Freq: Every day | ORAL | 3 refills | Status: DC
  Filled 2022-09-07: qty 90, 90d supply, fill #0
  Filled 2022-10-15 – 2023-01-23 (×2): qty 90, 90d supply, fill #1
  Filled 2023-05-25: qty 90, 90d supply, fill #2
  Filled 2023-09-06: qty 90, 90d supply, fill #3

## 2022-09-07 MED ORDER — METFORMIN HCL ER 500 MG PO TB24
500.0000 mg | ORAL_TABLET | Freq: Every day | ORAL | 3 refills | Status: DC
  Filled 2022-09-07: qty 90, 90d supply, fill #0
  Filled 2022-10-15 – 2023-01-23 (×2): qty 90, 90d supply, fill #1
  Filled 2023-05-25: qty 90, 90d supply, fill #2
  Filled 2023-09-06: qty 90, 90d supply, fill #3

## 2022-09-11 ENCOUNTER — Other Ambulatory Visit (HOSPITAL_COMMUNITY): Payer: Self-pay

## 2022-09-11 MED ORDER — CIPROFLOXACIN HCL 500 MG PO TABS
500.0000 mg | ORAL_TABLET | Freq: Two times a day (BID) | ORAL | 0 refills | Status: DC
  Filled 2022-09-11: qty 10, 5d supply, fill #0

## 2022-10-07 ENCOUNTER — Encounter: Payer: Self-pay | Admitting: Medical-Surgical

## 2022-10-15 ENCOUNTER — Other Ambulatory Visit (HOSPITAL_COMMUNITY): Payer: Self-pay

## 2022-10-16 ENCOUNTER — Other Ambulatory Visit (HOSPITAL_COMMUNITY): Payer: Self-pay

## 2022-12-20 ENCOUNTER — Other Ambulatory Visit: Payer: Self-pay | Admitting: Internal Medicine

## 2022-12-21 ENCOUNTER — Other Ambulatory Visit (HOSPITAL_COMMUNITY): Payer: Self-pay

## 2022-12-21 MED ORDER — PANTOPRAZOLE SODIUM 40 MG PO TBEC
40.0000 mg | DELAYED_RELEASE_TABLET | Freq: Every day | ORAL | 3 refills | Status: DC
  Filled 2022-12-21: qty 30, 30d supply, fill #0
  Filled 2023-02-18: qty 30, 30d supply, fill #1
  Filled 2023-03-29: qty 30, 30d supply, fill #2
  Filled 2023-05-05: qty 30, 30d supply, fill #3

## 2022-12-22 ENCOUNTER — Telehealth: Payer: 59 | Admitting: Physician Assistant

## 2022-12-22 DIAGNOSIS — B9689 Other specified bacterial agents as the cause of diseases classified elsewhere: Secondary | ICD-10-CM

## 2022-12-22 DIAGNOSIS — H109 Unspecified conjunctivitis: Secondary | ICD-10-CM

## 2022-12-23 ENCOUNTER — Other Ambulatory Visit (HOSPITAL_COMMUNITY): Payer: Self-pay

## 2022-12-23 MED ORDER — POLYMYXIN B-TRIMETHOPRIM 10000-0.1 UNIT/ML-% OP SOLN
1.0000 [drp] | OPHTHALMIC | 0 refills | Status: DC
  Filled 2022-12-23: qty 10, 10d supply, fill #0

## 2022-12-23 NOTE — Progress Notes (Signed)

## 2023-01-11 ENCOUNTER — Other Ambulatory Visit (HOSPITAL_COMMUNITY): Payer: Self-pay

## 2023-01-12 ENCOUNTER — Other Ambulatory Visit (HOSPITAL_COMMUNITY): Payer: Self-pay

## 2023-01-14 ENCOUNTER — Other Ambulatory Visit (HOSPITAL_COMMUNITY): Payer: Self-pay

## 2023-01-25 ENCOUNTER — Other Ambulatory Visit (HOSPITAL_COMMUNITY): Payer: Self-pay

## 2023-01-26 ENCOUNTER — Other Ambulatory Visit (HOSPITAL_COMMUNITY): Payer: Self-pay

## 2023-02-26 ENCOUNTER — Telehealth: Payer: 59 | Admitting: Family Medicine

## 2023-02-26 ENCOUNTER — Other Ambulatory Visit (HOSPITAL_COMMUNITY): Payer: Self-pay

## 2023-02-26 DIAGNOSIS — N3000 Acute cystitis without hematuria: Secondary | ICD-10-CM

## 2023-02-26 MED ORDER — NITROFURANTOIN MONOHYD MACRO 100 MG PO CAPS
100.0000 mg | ORAL_CAPSULE | Freq: Two times a day (BID) | ORAL | 0 refills | Status: AC
  Filled 2023-02-26: qty 14, 7d supply, fill #0

## 2023-02-26 NOTE — Progress Notes (Signed)
E-Visit for Urinary Problems  We are sorry that you are not feeling well.  Here is how we plan to help!  Based on what you shared with me it looks like you most likely have a simple urinary tract infection.  A UTI (Urinary Tract Infection) is a bacterial infection of the bladder.  Most cases of urinary tract infections are simple to treat but a key part of your care is to encourage you to drink plenty of fluids and watch your symptoms carefully.  I have prescribed MacroBid 100 mg twice a day for 5 days.  Your symptoms should gradually improve. Call us if the burning in your urine worsens, you develop worsening fever, back pain or pelvic pain or if your symptoms do not resolve after completing the antibiotic.  Urinary tract infections can be prevented by drinking plenty of water to keep your body hydrated.  Also be sure when you wipe, wipe from front to back and don't hold it in!  If possible, empty your bladder every 4 hours.  HOME CARE Drink plenty of fluids Compete the full course of the antibiotics even if the symptoms resolve Remember, when you need to go.go. Holding in your urine can increase the likelihood of getting a UTI! GET HELP RIGHT AWAY IF: You cannot urinate You get a high fever Worsening back pain occurs You see blood in your urine You feel sick to your stomach or throw up You feel like you are going to pass out  MAKE SURE YOU  Understand these instructions. Will watch your condition. Will get help right away if you are not doing well or get worse.   Thank you for choosing an e-visit.  Your e-visit answers were reviewed by a board certified advanced clinical practitioner to complete your personal care plan. Depending upon the condition, your plan could have included both over the counter or prescription medications.  Please review your pharmacy choice. Make sure the pharmacy is open so you can pick up prescription now. If there is a problem, you may contact your  provider through MyChart messaging and have the prescription routed to another pharmacy.  Your safety is important to us. If you have drug allergies check your prescription carefully.   For the next 24 hours you can use MyChart to ask questions about today's visit, request a non-urgent call back, or ask for a work or school excuse. You will get an email in the next two days asking about your experience. I hope that your e-visit has been valuable and will speed your recovery.    have provided 5 minutes of non face to face time during this encounter for chart review and documentation.   

## 2023-04-01 ENCOUNTER — Other Ambulatory Visit (HOSPITAL_COMMUNITY): Payer: Self-pay

## 2023-05-19 DIAGNOSIS — Z1231 Encounter for screening mammogram for malignant neoplasm of breast: Secondary | ICD-10-CM | POA: Diagnosis not present

## 2023-05-19 DIAGNOSIS — Z01419 Encounter for gynecological examination (general) (routine) without abnormal findings: Secondary | ICD-10-CM | POA: Diagnosis not present

## 2023-05-19 DIAGNOSIS — Z1151 Encounter for screening for human papillomavirus (HPV): Secondary | ICD-10-CM | POA: Diagnosis not present

## 2023-05-19 DIAGNOSIS — Z124 Encounter for screening for malignant neoplasm of cervix: Secondary | ICD-10-CM | POA: Diagnosis not present

## 2023-05-19 DIAGNOSIS — Z1389 Encounter for screening for other disorder: Secondary | ICD-10-CM | POA: Diagnosis not present

## 2023-05-24 NOTE — Progress Notes (Signed)
New Patient Office Visit  Subjective    Patient ID: Denise Strong, female    DOB: [birth year]  Age: 47 y.o. MRN: [id number]  CC:  Chief Complaint  Patient presents with   Establish Care    HPI Denise Strong presents to establish care.  Patient was previously seeing Dr. Sharee Holster when she was here, after that was seeing a provider in [place].  HLD-patient takes Merchandiser, retail.  Anxiety-patient takes Wellbutrin.  Is satisfied with this dose.  Prediabetes-patient no longer taking Mounjaro as it is no longer covered.  Patient is taking metformin.  Weight loss-patient asking about Contrave as an alternative.  Discussed how she is taking Wellbutrin already and Wellbutrin dose would need to be adjusted.  Discussed not knowing if her insurance covers this medication  Patient has a spot on her face and family history of malignant melanoma in her cousin and would like referral to dermatology.  Vitamin D deficiency-patient does not currently take any vitamin D supplementation.  Uncertain when last time she took any.   Health Maintenance Due  Topic Date Due   COVID-19 Vaccine (5 - 2023-24 season) 06/12/2022   PAP SMEAR-Modifier  06/17/2022   INFLUENZA VACCINE  05/13/2023     PMH: HLD, anxiety, prediabets  PSH: appendectomy,  gallbladder  FH: dm2  - mother, brother, grandmother.  Father htn.    Tobacco use: no  Alcohol use: occasional Drug use: no Marital status: married, same sex partner.  Has 2 children with her.   Employment: [employer].  Previously worked as an Animator.   Screenings:  Colon Cancer: sept 2023, repeat in 5 years.  Pap smear: just had one Tuesday - [facility], mammogram.     Outpatient Encounter Medications as of 05/25/2023  Medication Sig   buPROPion (WELLBUTRIN XL) 300 MG 24 hr tablet Take 1 tablet (300 mg total) by mouth every morning.   fenofibrate (TRICOR) 145 MG tablet Take 1 tablet (145 mg total) by mouth daily.    metFORMIN (GLUCOPHAGE-XR) 500 MG 24 hr tablet Take 1 tablet (500 mg total) by mouth daily with breakfast.   pantoprazole (PROTONIX) 40 MG tablet Take 1 tablet (40 mg total) by mouth daily.   rosuvastatin (CRESTOR) 10 MG tablet Take 1 tablet (10 mg total) by mouth daily.   tirzepatide (MOUNJARO) 7.5 MG/0.5ML Pen Inject 7.5 mg into the skin once a week.   trimethoprim-polymyxin b (POLYTRIM) ophthalmic solution Place 1 drop into both eyes every 4 (four) hours. X 5 days   No facility-administered encounter medications on file as of 05/25/2023.    Past Medical History:  Diagnosis Date   Anxiety    Chest pain 02/20/2016   Constipation    Elevated fasting blood sugar    Gallstones    Hyperlipidemia 02/20/2016   Kidney stones    Multiple food allergies    Obesity    Palpitations 02/20/2016   Renal disorder     Past Surgical History:  Procedure Laterality Date   APPENDECTOMY     CHOLECYSTECTOMY      Family History  Problem Relation Age of Onset   Diabetes Mother    Hypertension Father    Stroke Father    Heart disease Father    Sleep apnea Father    Colon polyps Father    Diabetes Brother    Diabetes Maternal Grandmother    AAA (abdominal aortic aneurysm) Maternal Grandfather    Dementia Paternal Grandfather  Social History   Socioeconomic History   Marital status: Married    Spouse name: Not on file   Number of children: Not on file   Years of education: Not on file   Highest education level: Not on file  Occupational History   Not on file  Tobacco Use   Smoking status: Former    Current packs/day: 0.00    Types: Cigarettes    Quit date: 02/20/1995    Years since quitting: 28.2    Passive exposure: Past   Smokeless tobacco: Never   Tobacco comments:    social; in college  Vaping Use   Vaping status: Never Used  Substance and Sexual Activity   Alcohol use: No    Alcohol/week: 0.0 standard drinks of alcohol   Drug use: No   Sexual activity: Not on file   Other Topics Concern   Not on file  Social History Narrative   Not on file   Social Determinants of Health   Financial Resource Strain: Not on file  Food Insecurity: Not on file  Transportation Needs: Not on file  Physical Activity: Not on file  Stress: Not on file  Social Connections: Not on file  Intimate Partner Violence: Not on file    ROS      Objective    BP 125/73 (BP Location: Left Arm, Patient Position: Sitting, Cuff Size: Normal)   Pulse 71   Resp 20   Ht 5\' 5"  (1.651 m)   Wt 164 lb (74.4 kg)   SpO2 98%   BMI 27.29 kg/m   Physical Exam General: Alert, oriented HEENT: PERRLA, EOMI, moist mucosa CV: Regular rate and rhythm Pulmonary: Lungs clear bilaterally GI: Soft, nontender MSK: Strength equal bilaterally Extremities: No pedal edema Psych: Pleasant affect Skin: Warm and dry     Assessment & Plan:   Mixed hyperlipidemia Assessment & Plan: Taking Tricor and Crestor.  Recheck lipid panel today.  Continue current medications.   Hyperlipidemia, unspecified hyperlipidemia type Assessment & Plan: Taking Tricor and Crestor.  Recheck lipid panel today.  Continue current medications.  Orders: -     Lipid panel -     Comprehensive metabolic panel  Vitamin D deficiency Assessment & Plan: Recheck vitamin D today.  Not currently taking vitamin D supplement.  Orders: -     VITAMIN D 25 Hydroxy (Vit-D Deficiency, Fractures) -     CBC  Prediabetes Assessment & Plan: Continue metformin.  Rechecking A1c.  Orders: -     Hemoglobin A1c -     CBC  Generalized anxiety disorder Assessment & Plan: Currently taking Wellbutrin 300 daily   Class 1 obesity with serious comorbidity and body mass index (BMI) of 30.0 to 30.9 in adult, unspecified obesity type Assessment & Plan: Patient wants to consider taking Contrave if appropriate and covered by her insurance.  Patient is already taking Wellbutrin.  Sent a message to the patient regarding the  following titration schedule if she were to transition from her Wellbutrin to Contrave.  Patient would need to stop taking her current bupropion medication and a prescription for 100 mg tablets of bupropion would need to be sent in.  Week 1-you take 1 tablet of Contrave in the morning, 200 mg of bupropion in the evening Week 2-you take 1 tablet of Contrave twice a day.  With these doses you also take 100 mg of bupropion Week 3-you take 2 tablets of Contrave in the morning, 1 in the evening as well as 100 mg  of bupropion in the evening Week 4-you take 2 tablets of Contrave twice daily.  This would be the maintenance dose.     Return in about 4 weeks (around 06/22/2023) for Weight loss.   Sandre Kitty, MD

## 2023-05-25 ENCOUNTER — Encounter: Payer: Self-pay | Admitting: Family Medicine

## 2023-05-25 ENCOUNTER — Ambulatory Visit (INDEPENDENT_AMBULATORY_CARE_PROVIDER_SITE_OTHER): Payer: 59 | Admitting: Family Medicine

## 2023-05-25 VITALS — BP 125/73 | HR 71 | Resp 20 | Ht 65.0 in | Wt 164.0 lb

## 2023-05-25 DIAGNOSIS — E669 Obesity, unspecified: Secondary | ICD-10-CM | POA: Diagnosis not present

## 2023-05-25 DIAGNOSIS — E559 Vitamin D deficiency, unspecified: Secondary | ICD-10-CM | POA: Diagnosis not present

## 2023-05-25 DIAGNOSIS — E785 Hyperlipidemia, unspecified: Secondary | ICD-10-CM

## 2023-05-25 DIAGNOSIS — E782 Mixed hyperlipidemia: Secondary | ICD-10-CM | POA: Diagnosis not present

## 2023-05-25 DIAGNOSIS — F411 Generalized anxiety disorder: Secondary | ICD-10-CM

## 2023-05-25 DIAGNOSIS — R7303 Prediabetes: Secondary | ICD-10-CM

## 2023-05-25 DIAGNOSIS — Z6830 Body mass index (BMI) 30.0-30.9, adult: Secondary | ICD-10-CM

## 2023-05-25 LAB — HM PAP SMEAR
HM Pap smear: NEGATIVE
HPV, high-risk: NEGATIVE

## 2023-05-25 NOTE — Assessment & Plan Note (Signed)
Recheck vitamin D today.  Not currently taking vitamin D supplement.

## 2023-05-25 NOTE — Assessment & Plan Note (Signed)
Currently taking Wellbutrin 300 daily

## 2023-05-25 NOTE — Assessment & Plan Note (Signed)
Continue metformin.  Rechecking A1c.

## 2023-05-25 NOTE — Assessment & Plan Note (Addendum)
Patient wants to consider taking Contrave if appropriate and covered by her insurance.  Patient is already taking Wellbutrin.  Sent a message to the patient regarding the following titration schedule if she were to transition from her Wellbutrin to Contrave.  Patient would need to stop taking her current bupropion medication and a prescription for 100 mg tablets of bupropion would need to be sent in.  Week 1-you take 1 tablet of Contrave in the morning, 200 mg of bupropion in the evening Week 2-you take 1 tablet of Contrave twice a day.  With these doses you also take 100 mg of bupropion Week 3-you take 2 tablets of Contrave in the morning, 1 in the evening as well as 100 mg of bupropion in the evening Week 4-you take 2 tablets of Contrave twice daily.  This would be the maintenance dose.

## 2023-05-25 NOTE — Assessment & Plan Note (Addendum)
Taking Tricor and Crestor.  Recheck lipid panel today.  Continue current medications.

## 2023-05-25 NOTE — Patient Instructions (Signed)
It was nice to see you today,  We addressed the following topics today: -I will get some labs for you today.  I will follow-up with you when I get the results - I will order the Contrave.  We may need to adjust your Wellbutrin dosage so I will let you know before I send in the prescription - Follow-up with me in 1 month to discuss weight loss - I will send in referral to dermatology  Have a great day,  Frederic Jericho, MD

## 2023-05-26 ENCOUNTER — Encounter: Payer: Self-pay | Admitting: Medical-Surgical

## 2023-05-28 ENCOUNTER — Other Ambulatory Visit (HOSPITAL_COMMUNITY): Payer: Self-pay

## 2023-05-28 ENCOUNTER — Encounter: Payer: Self-pay | Admitting: Family Medicine

## 2023-05-28 ENCOUNTER — Other Ambulatory Visit: Payer: Self-pay | Admitting: Family Medicine

## 2023-05-28 MED ORDER — BUPROPION HCL ER (SR) 100 MG PO TB12
ORAL_TABLET | ORAL | 0 refills | Status: DC
  Filled 2023-05-28: qty 35, 21d supply, fill #0

## 2023-05-28 MED ORDER — CONTRAVE 8-90 MG PO TB12
ORAL_TABLET | ORAL | 2 refills | Status: DC
  Filled 2023-05-28: qty 120, 30d supply, fill #0

## 2023-06-22 ENCOUNTER — Other Ambulatory Visit: Payer: Self-pay

## 2023-06-22 ENCOUNTER — Encounter: Payer: Self-pay | Admitting: Family Medicine

## 2023-06-22 ENCOUNTER — Ambulatory Visit (INDEPENDENT_AMBULATORY_CARE_PROVIDER_SITE_OTHER): Payer: 59 | Admitting: Family Medicine

## 2023-06-22 VITALS — BP 102/66 | HR 64 | Ht 65.0 in | Wt 160.1 lb

## 2023-06-22 DIAGNOSIS — E669 Obesity, unspecified: Secondary | ICD-10-CM

## 2023-06-22 DIAGNOSIS — E559 Vitamin D deficiency, unspecified: Secondary | ICD-10-CM

## 2023-06-22 DIAGNOSIS — Z6830 Body mass index (BMI) 30.0-30.9, adult: Secondary | ICD-10-CM | POA: Diagnosis not present

## 2023-06-22 NOTE — Assessment & Plan Note (Addendum)
Patient has not yet received Contrave due to needing a prior authorization.  Pharmacy states it was sent previously and they resent it again today.  Did not receive the first 1, but we have received the one from today.  Will fill it out.  Patient very health literate and has no questions regarding transitioning from her bupropion to Contrave.  Will follow-up in January.  Patient is currently adhering to a diet plan prescribed by previous obesity medicine specialist.  She also exercises 3 to 5 days a week with virtual group classes through [employer]

## 2023-06-22 NOTE — Patient Instructions (Addendum)
It was nice to see you today,  We addressed the following topics today: -I will look into the prior authorization status and fill it out if needed - I would like to see back in 3 months in January - For your vitamin D which was borderline, you can take an over-the-counter supplement.  1000 to 2000 IU daily of vitamin D3 should be sufficient.    Have a great day,  Frederic Jericho, MD

## 2023-06-22 NOTE — Progress Notes (Addendum)
Established Patient Office Visit  Subjective   Patient ID: Denise Strong, female    DOB: [birth year]  Age: 47 y.o. MRN: [id number]  Chief Complaint  Patient presents with   Weight Check    HPI  Weight loss-patient has not yet received her Contrave for this is due to not getting the prior authorization filled out.  She states the pharmacy resent it this morning.  She is still taking her bupropion  Addendum: Spoke with patient over the phone after our visit to clarify her current exercise/diet program.  Patient has an [employer] membership and uses the eye fit bike and treadmill 3 to 5 days a week.  She is following a category 2 prescribed meal plan from [facility].  Patient also attends nutrition coaching from [facility] health live life well program  Vitamin D-her vitamin D level was borderline for insufficiency at her last visit.  We discussed daily maintenance vitamin D supplementation of 1000 to 2000 IUs daily.  The 10-year ASCVD risk score (Arnett DK, et al., 2019) is: 0.4%  Health Maintenance Due  Topic Date Due   INFLUENZA VACCINE  05/13/2023   COVID-19 Vaccine (5 - 2023-24 season) 06/13/2023      Objective:     BP 102/66   Pulse 64   Ht 5\' 5"  (1.651 m)   Wt 160 lb 1.9 oz (72.6 kg)   SpO2 97%   BMI 26.65 kg/m    Physical Exam General: Alert, oriented Pulmonary: No respiratory distress Psych: Pleasant affect  No results found for any visits on 06/22/23.      Assessment & Plan:   Class 1 obesity with serious comorbidity and body mass index (BMI) of 30.0 to 30.9 in adult, unspecified obesity type Assessment & Plan: Patient has not yet received Contrave due to needing a prior authorization.  Pharmacy states it was sent previously and they resent it again today.  Did not receive the first 1, but we have received the one from today.  Will fill it out.  Patient very health literate and has no questions regarding transitioning from her  bupropion to Contrave.  Will follow-up in January.  Patient is currently adhering to a diet plan prescribed by previous obesity medicine specialist.  She also exercises 3 to 5 days a week with virtual group classes through [employer]   Vitamin D deficiency Assessment & Plan: Most recent value was borderline at 30.  Discussed daily over-the-counter supplementation with 1000 to 2000 IU vitamin D3.      Return in about 4 months (around 10/22/2023) for Weight.    Sandre Kitty, MD

## 2023-06-22 NOTE — Assessment & Plan Note (Signed)
Most recent value was borderline at 30.  Discussed daily over-the-counter supplementation with 1000 to 2000 IU vitamin D3.

## 2023-06-25 ENCOUNTER — Encounter: Payer: Self-pay | Admitting: Family Medicine

## 2023-06-26 ENCOUNTER — Other Ambulatory Visit: Payer: Self-pay | Admitting: Internal Medicine

## 2023-06-28 ENCOUNTER — Other Ambulatory Visit (HOSPITAL_COMMUNITY): Payer: Self-pay

## 2023-06-28 MED ORDER — PANTOPRAZOLE SODIUM 40 MG PO TBEC
40.0000 mg | DELAYED_RELEASE_TABLET | Freq: Every day | ORAL | 3 refills | Status: DC
  Filled 2023-06-28: qty 30, 30d supply, fill #0
  Filled 2023-07-29: qty 30, 30d supply, fill #1
  Filled 2023-09-06: qty 30, 30d supply, fill #2
  Filled 2023-10-14: qty 30, 30d supply, fill #3

## 2023-07-02 ENCOUNTER — Other Ambulatory Visit (HOSPITAL_COMMUNITY): Payer: Self-pay

## 2023-07-02 ENCOUNTER — Encounter (HOSPITAL_COMMUNITY): Payer: Self-pay

## 2023-09-06 ENCOUNTER — Other Ambulatory Visit: Payer: Self-pay | Admitting: Family Medicine

## 2023-09-06 ENCOUNTER — Encounter: Payer: Self-pay | Admitting: Family Medicine

## 2023-09-06 ENCOUNTER — Other Ambulatory Visit (HOSPITAL_COMMUNITY): Payer: Self-pay

## 2023-09-06 MED ORDER — BUPROPION HCL ER (XL) 300 MG PO TB24
300.0000 mg | ORAL_TABLET | Freq: Every day | ORAL | 3 refills | Status: DC
  Filled 2023-09-06: qty 90, 90d supply, fill #0
  Filled 2023-12-22: qty 90, 90d supply, fill #1
  Filled 2024-03-20: qty 90, 90d supply, fill #2
  Filled 2024-06-22: qty 90, 90d supply, fill #3

## 2023-10-14 ENCOUNTER — Other Ambulatory Visit (HOSPITAL_COMMUNITY): Payer: Self-pay

## 2023-10-22 ENCOUNTER — Other Ambulatory Visit (HOSPITAL_COMMUNITY): Payer: Self-pay

## 2023-10-22 ENCOUNTER — Ambulatory Visit (INDEPENDENT_AMBULATORY_CARE_PROVIDER_SITE_OTHER): Payer: 59 | Admitting: Family Medicine

## 2023-10-22 ENCOUNTER — Encounter: Payer: Self-pay | Admitting: Family Medicine

## 2023-10-22 VITALS — BP 128/64 | HR 63 | Ht 65.0 in | Wt 171.8 lb

## 2023-10-22 DIAGNOSIS — E782 Mixed hyperlipidemia: Secondary | ICD-10-CM | POA: Diagnosis not present

## 2023-10-22 DIAGNOSIS — L989 Disorder of the skin and subcutaneous tissue, unspecified: Secondary | ICD-10-CM | POA: Diagnosis not present

## 2023-10-22 DIAGNOSIS — E66811 Obesity, class 1: Secondary | ICD-10-CM | POA: Diagnosis not present

## 2023-10-22 DIAGNOSIS — Z6830 Body mass index (BMI) 30.0-30.9, adult: Secondary | ICD-10-CM

## 2023-10-22 DIAGNOSIS — R7303 Prediabetes: Secondary | ICD-10-CM | POA: Diagnosis not present

## 2023-10-22 MED ORDER — PHENTERMINE HCL 15 MG PO CAPS
15.0000 mg | ORAL_CAPSULE | ORAL | 1 refills | Status: DC
  Filled 2023-10-22: qty 30, 30d supply, fill #0
  Filled 2023-11-23: qty 30, 30d supply, fill #1

## 2023-10-22 MED ORDER — CONTRAVE 8-90 MG PO TB12
ORAL_TABLET | ORAL | 1 refills | Status: AC
  Filled 2023-10-22 – 2023-10-29 (×3): qty 120, 42d supply, fill #0

## 2023-10-22 NOTE — Assessment & Plan Note (Signed)
 Has had about a 10 pound weight gain since I last saw her.  She is now at a BMI of 28.59.  We checked her height to confirm accuracy.  She continues to do the exercise and diet programs she was previously doing, and now also has a systems developer through [employer].  We talked about other medications.  She is willing to try phentermine .  She would like to try the Contrave  again.  Previously it was not prescribed because it stated her BMI was less than 27.  She now meets the criteria so we will resend the Contrave  prescription in.  Will follow same plan as before as far as titrating down her bupropion  while the Contrave  dose increases.

## 2023-10-22 NOTE — Assessment & Plan Note (Signed)
 Would like dermatology referral for general skin evaluation.  Specific concern includes the hyperpigmented area noted in the picture below at the lateral edge of her eyebrow.

## 2023-10-22 NOTE — Patient Instructions (Signed)
 It was nice to see you today,  We addressed the following topics today: -I will send in a prescription for phentermine .  I will send in a lower dose and if we need to we can always increase it later.  I will see back in 1 month. - I will also work on sending in a prescription for Contrave . - I will send in a referral to dermatology.  When I see you again in a month let me know if you have not heard from anybody   Have a great day,  Rolan Slain, MD

## 2023-10-22 NOTE — Progress Notes (Signed)
 Established Patient Office Visit  Subjective   Patient ID: Denise Strong, female    DOB: Aug 01, 1976  Age: 48 y.o. MRN: [id number]  Chief Complaint  Patient presents with   Weight Check    HPI  Hyperlipidemia-takes rosuvastatin  and fenofibrate .  Has no issues with these medications.  Is compliant.  Patient taking her bupropion  for anxiety.  No issues with this medication.  Weight-patient has gained weight since last time I saw her, approximately 10 pounds.  We discussed medication options again.  She is still on a diet plan, exercising regularly through her [employer] membership and also is now seeing a wellness coach through her insurance.  We discussed medication options including phentermine  as well as Contrave .  She previously was not eligible for Contrave  due to her BMI, but BMI is now above the limit mentioned in her prior authorization denial.  She is agreeable to starting the phentermine  while she awaits the prior authorization process again for Contrave .  She states that her issue is with cravings mostly.  Feels like she always feels hungry.  Patient would like a dermatology referral for general skin check but also for a lesion on her right temporal area near the eyebrow.   The 10-year ASCVD risk score (Arnett DK, et al., 2019) is: 0.6%  Health Maintenance Due  Topic Date Due   INFLUENZA VACCINE  05/13/2023   COVID-19 Vaccine (5 - 2024-25 season) 06/13/2023      Objective:     BP 128/64   Pulse 63   Ht 5' 5 (1.651 m)   Wt 171 lb 12.8 oz (77.9 kg)   SpO2 100%   BMI 28.59 kg/m    Physical Exam General: Alert, oriented Pulmonary: No respiratory distress Skin: Small hyperpigmented circular raised, rough lesion in the right temporal area near the lateral aspect of the eyebrow.   No results found for any visits on 10/22/23.      Assessment & Plan:   Class 1 obesity with serious comorbidity and body mass index (BMI) of 30.0 to 30.9 in adult, unspecified  obesity type Assessment & Plan: Has had about a 10 pound weight gain since I last saw her.  She is now at a BMI of 28.59.  We checked her height to confirm accuracy.  She continues to do the exercise and diet programs she was previously doing, and now also has a systems developer through [employer].  We talked about other medications.  She is willing to try phentermine .  She would like to try the Contrave  again.  Previously it was not prescribed because it stated her BMI was less than 27.  She now meets the criteria so we will resend the Contrave  prescription in.  Will follow same plan as before as far as titrating down her bupropion  while the Contrave  dose increases.   Skin lesion Assessment & Plan: Would like dermatology referral for general skin evaluation.  Specific concern includes the hyperpigmented area noted in the picture below at the lateral edge of her eyebrow.     Orders: -     Ambulatory referral to Dermatology  Prediabetes Assessment & Plan: Takes metformin  500mg  daily.  Continue.  Recheck yearly   Mixed hyperlipidemia Assessment & Plan: Continue with rosuvastatin  and fenofibrate .  Recheck yearly.  Currently at goal.   Other orders -     Phentermine  HCl; Take 1 capsule (15 mg total) by mouth every morning.  Dispense: 30 capsule; Refill: 1 -  Contrave ; Week 1-1 tablet in a.m.; week 2-1 tablet twice daily; week 3-2 tablet a.m., 1 tablet p.m.; week 4-2 tablets twice daily  Dispense: 120 tablet; Refill: 1     Return in about 4 weeks (around 11/19/2023) for weight.    Toribio MARLA Slain, MD

## 2023-10-22 NOTE — Assessment & Plan Note (Signed)
 Takes metformin 500mg  daily.  Continue.  Recheck yearly

## 2023-10-22 NOTE — Assessment & Plan Note (Signed)
 Continue with rosuvastatin and fenofibrate.  Recheck yearly.  Currently at goal.

## 2023-10-27 ENCOUNTER — Other Ambulatory Visit (HOSPITAL_COMMUNITY): Payer: Self-pay

## 2023-10-29 ENCOUNTER — Other Ambulatory Visit (HOSPITAL_COMMUNITY): Payer: Self-pay

## 2023-11-15 ENCOUNTER — Encounter: Payer: Self-pay | Admitting: Family Medicine

## 2023-11-15 ENCOUNTER — Other Ambulatory Visit (HOSPITAL_COMMUNITY): Payer: Self-pay

## 2023-11-15 ENCOUNTER — Other Ambulatory Visit: Payer: Self-pay | Admitting: Internal Medicine

## 2023-11-16 ENCOUNTER — Other Ambulatory Visit: Payer: Self-pay | Admitting: Internal Medicine

## 2023-11-16 ENCOUNTER — Encounter (HOSPITAL_COMMUNITY): Payer: Self-pay

## 2023-11-16 ENCOUNTER — Other Ambulatory Visit (HOSPITAL_COMMUNITY): Payer: Self-pay

## 2023-11-16 ENCOUNTER — Encounter: Payer: Self-pay | Admitting: Family Medicine

## 2023-11-18 ENCOUNTER — Encounter (HOSPITAL_COMMUNITY): Payer: Self-pay

## 2023-11-18 ENCOUNTER — Other Ambulatory Visit (HOSPITAL_COMMUNITY): Payer: Self-pay

## 2023-11-23 ENCOUNTER — Other Ambulatory Visit: Payer: Self-pay

## 2023-11-23 ENCOUNTER — Ambulatory Visit: Payer: 59 | Admitting: Family Medicine

## 2023-12-22 ENCOUNTER — Other Ambulatory Visit: Payer: Self-pay | Admitting: Medical-Surgical

## 2023-12-22 ENCOUNTER — Other Ambulatory Visit: Payer: Self-pay | Admitting: Family Medicine

## 2023-12-23 ENCOUNTER — Other Ambulatory Visit: Payer: Self-pay

## 2023-12-24 ENCOUNTER — Other Ambulatory Visit: Payer: Self-pay

## 2023-12-24 ENCOUNTER — Encounter: Payer: Self-pay | Admitting: Family Medicine

## 2023-12-24 ENCOUNTER — Other Ambulatory Visit (HOSPITAL_COMMUNITY): Payer: Self-pay

## 2023-12-24 MED ORDER — PHENTERMINE HCL 15 MG PO CAPS
15.0000 mg | ORAL_CAPSULE | ORAL | 2 refills | Status: DC
  Filled 2023-12-24: qty 30, 30d supply, fill #0
  Filled 2024-02-01: qty 30, 30d supply, fill #1
  Filled 2024-03-06: qty 30, 30d supply, fill #2

## 2023-12-27 ENCOUNTER — Other Ambulatory Visit: Payer: Self-pay | Admitting: *Deleted

## 2023-12-27 ENCOUNTER — Other Ambulatory Visit (HOSPITAL_COMMUNITY): Payer: Self-pay

## 2023-12-27 ENCOUNTER — Encounter (HOSPITAL_COMMUNITY): Payer: Self-pay

## 2023-12-27 MED ORDER — FENOFIBRATE 145 MG PO TABS
145.0000 mg | ORAL_TABLET | Freq: Every day | ORAL | 3 refills | Status: AC
  Filled 2023-12-27: qty 90, 90d supply, fill #0
  Filled 2024-05-10: qty 90, 90d supply, fill #1
  Filled 2024-07-23: qty 90, 90d supply, fill #2
  Filled 2024-11-02: qty 90, 90d supply, fill #3

## 2023-12-27 MED ORDER — ROSUVASTATIN CALCIUM 10 MG PO TABS
10.0000 mg | ORAL_TABLET | Freq: Every day | ORAL | 3 refills | Status: AC
  Filled 2023-12-27: qty 90, 90d supply, fill #0
  Filled 2024-04-10: qty 90, 90d supply, fill #1
  Filled 2024-07-23: qty 90, 90d supply, fill #2
  Filled 2024-11-03: qty 90, 90d supply, fill #3

## 2023-12-27 MED ORDER — METFORMIN HCL ER 500 MG PO TB24
500.0000 mg | ORAL_TABLET | Freq: Every day | ORAL | 3 refills | Status: AC
  Filled 2023-12-27: qty 90, 90d supply, fill #0
  Filled 2024-04-10: qty 90, 90d supply, fill #1
  Filled 2024-07-23: qty 90, 90d supply, fill #2
  Filled 2024-11-03: qty 90, 90d supply, fill #3

## 2024-02-01 ENCOUNTER — Other Ambulatory Visit: Payer: Self-pay

## 2024-02-01 ENCOUNTER — Ambulatory Visit: Admitting: Dermatology

## 2024-02-01 ENCOUNTER — Other Ambulatory Visit (HOSPITAL_COMMUNITY): Payer: Self-pay

## 2024-02-01 ENCOUNTER — Encounter: Payer: Self-pay | Admitting: Dermatology

## 2024-02-01 VITALS — BP 119/85 | HR 82

## 2024-02-01 DIAGNOSIS — L570 Actinic keratosis: Secondary | ICD-10-CM

## 2024-02-01 DIAGNOSIS — L821 Other seborrheic keratosis: Secondary | ICD-10-CM

## 2024-02-01 DIAGNOSIS — D1801 Hemangioma of skin and subcutaneous tissue: Secondary | ICD-10-CM

## 2024-02-01 DIAGNOSIS — W908XXA Exposure to other nonionizing radiation, initial encounter: Secondary | ICD-10-CM | POA: Diagnosis not present

## 2024-02-01 DIAGNOSIS — L578 Other skin changes due to chronic exposure to nonionizing radiation: Secondary | ICD-10-CM

## 2024-02-01 DIAGNOSIS — D492 Neoplasm of unspecified behavior of bone, soft tissue, and skin: Secondary | ICD-10-CM | POA: Diagnosis not present

## 2024-02-01 DIAGNOSIS — Z808 Family history of malignant neoplasm of other organs or systems: Secondary | ICD-10-CM | POA: Diagnosis not present

## 2024-02-01 DIAGNOSIS — D229 Melanocytic nevi, unspecified: Secondary | ICD-10-CM

## 2024-02-01 DIAGNOSIS — L820 Inflamed seborrheic keratosis: Secondary | ICD-10-CM | POA: Diagnosis not present

## 2024-02-01 DIAGNOSIS — D485 Neoplasm of uncertain behavior of skin: Secondary | ICD-10-CM

## 2024-02-01 DIAGNOSIS — L814 Other melanin hyperpigmentation: Secondary | ICD-10-CM | POA: Diagnosis not present

## 2024-02-01 DIAGNOSIS — B079 Viral wart, unspecified: Secondary | ICD-10-CM

## 2024-02-01 MED ORDER — FLUOROURACIL 5 % EX CREA
TOPICAL_CREAM | Freq: Every day | CUTANEOUS | 2 refills | Status: DC
  Filled 2024-02-01: qty 40, 30d supply, fill #0

## 2024-02-01 NOTE — Progress Notes (Signed)
 New Patient Visit   Subjective  Denise Strong is a 48 y.o. female who presents for the following: Skin Cancer Screening and Full Body Skin Exam.No hx of skin cancer.  Family hx of NMSC and MM.  Lesion of concern on right temple, present for several months, not treated, slightly tender  Lesion of concern on left upper lip, present for several months, not treated, scaly  The patient presents for Total-Body Skin Exam (TBSE) for skin cancer screening and mole check. The patient has spots, moles and lesions to be evaluated, some may be new or changing.  The following portions of the chart were reviewed this encounter and updated as appropriate: medications, allergies, medical history  Review of Systems:  No other skin or systemic complaints except as noted in HPI or Assessment and Plan.  Objective  Well appearing patient in no apparent distress; mood and affect are within normal limits.  A full examination was performed including scalp, head, eyes, ears, nose, lips, neck, chest, axillae, abdomen, back, buttocks, bilateral upper extremities, bilateral lower extremities, hands, feet, fingers, toes, fingernails, and toenails. All findings within normal limits unless otherwise noted below.   Relevant physical exam findings are noted in the Assessment and Plan.  Right Temple 4 mm pink brown papyle  Left Upper Cutaneous Lip Erythematous thin papules/macules with gritty scale.   Assessment & Plan   SKIN CANCER SCREENING PERFORMED TODAY.  ACTINIC DAMAGE - Chronic condition, secondary to cumulative UV/sun exposure - diffuse scaly erythematous macules with underlying dyspigmentation - Recommend daily broad spectrum sunscreen SPF 30+ to sun-exposed areas, reapply every 2 hours as needed.  - Staying in the shade or wearing long sleeves, sun glasses (UVA+UVB protection) and wide brim hats (4-inch brim around the entire circumference of the hat) are also recommended for sun protection.  -  Call for new or changing lesions.  LENTIGINES, SEBORRHEIC KERATOSES, HEMANGIOMAS - Benign normal skin lesions - Benign-appearing - Call for any changes  MELANOCYTIC NEVI - Tan-brown and/or pink-flesh-colored symmetric macules and papules - Benign appearing on exam today - Observation - Call clinic for new or changing moles - Recommend daily use of broad spectrum spf 30+ sunscreen to sun-exposed areas.   WART Exam: verrucous papule(s) on right fingers  Counseling Discussed viral / HPV (Human Papilloma Virus) etiology and risk of spread /infectivity to other areas of body as well as to other people.  Multiple treatments and methods may be required to clear warts and it is possible treatment may not be successful.  Treatment risks include discoloration; scarring and there is still potential for wart recurrence.  Treatment Plan: Cryo today Continue Wart Stick 5FU nightly  ACTINIC KERATOSIS Exam: Erythematous thin papules/macules with gritty scale  Actinic keratoses are precancerous spots that appear secondary to cumulative UV radiation exposure/sun exposure over time. They are chronic with expected duration over 1 year. A portion of actinic keratoses will progress to squamous cell carcinoma of the skin. It is not possible to reliably predict which spots will progress to skin cancer and so treatment is recommended to prevent development of skin cancer.  Recommend daily broad spectrum sunscreen SPF 30+ to sun-exposed areas, reapply every 2 hours as needed.  Recommend staying in the shade or wearing long sleeves, sun glasses (UVA+UVB protection) and wide brim hats (4-inch brim around the entire circumference of the hat). Call for new or changing lesions.  Treatment Plan:  Prior to procedure, discussed risks of blister formation, small wound, skin dyspigmentation, or rare  scar following cryotherapy. Recommend Vaseline ointment to treated areas while healing.  Destruction Procedure  Note Destruction method: cryotherapy   Informed consent: discussed and consent obtained   Lesion destroyed using liquid nitrogen: Yes   Outcome: patient tolerated procedure well with no complications   Post-procedure details: wound care instructions given   Locations: left upper cutaneous lip # of Lesions Treated: 1  VIRAL WARTS, UNSPECIFIED TYPE (3) Right Palmar Middle 4th Finger (2), Right Palmar Middle 5th Finger Destruction of lesion - Right Palmar Middle 4th Finger (2), Right Palmar Middle 5th Finger Complexity: simple   Destruction method: cryotherapy   Informed consent: discussed and consent obtained   Outcome: patient tolerated procedure well with no complications   Related Medications fluorouracil  (EFUDEX ) 5 % cream Apply topically every night under duct tape NEOPLASM OF UNCERTAIN BEHAVIOR OF SKIN Right Temple Skin / nail biopsy Type of biopsy: tangential   Informed consent: discussed and consent obtained   Timeout: patient name, date of birth, surgical site, and procedure verified   Procedure prep:  Patient was prepped and draped in usual sterile fashion Prep type:  Isopropyl alcohol Anesthesia: the lesion was anesthetized in a standard fashion   Anesthetic:  1% lidocaine w/ epinephrine 1-100,000 buffered w/ 8.4% NaHCO3 Instrument used: DermaBlade   Hemostasis achieved with: aluminum chloride   Outcome: patient tolerated procedure well   Post-procedure details: sterile dressing applied and wound care instructions given   Dressing type: bandage and petrolatum   Specimen 1 - Surgical pathology Differential Diagnosis: r/o nmsc vs other  Check Margins: No AK (ACTINIC KERATOSIS) Left Upper Cutaneous Lip Destruction of lesion - Left Upper Cutaneous Lip Complexity: simple   Destruction method: cryotherapy   Timeout:  patient name, date of birth, surgical site, and procedure verified Outcome: patient tolerated procedure well with no complications   Post-procedure  details: wound care instructions given   ACTINIC SKIN DAMAGE   LENTIGINES   SEBORRHEIC KERATOSES   CHERRY ANGIOMA   MULTIPLE BENIGN NEVI    Return in about 4 weeks (around 02/29/2024) for wart f/u.  I, Haig Levan, Surg Tech III, am acting as scribe for [name], MD.   Documentation: I have reviewed the above documentation for accuracy and completeness, and I agree with the above.  [name], MD

## 2024-02-01 NOTE — Patient Instructions (Signed)
 Important Information  Due to recent changes in healthcare laws, you may see results of your pathology and/or laboratory studies on MyChart before the doctors have had a chance to review them. We understand that in some cases there may be results that are confusing or concerning to you. Please understand that not all results are received at the same time and often the doctors may need to interpret multiple results in order to provide you with the best plan of care or course of treatment. Therefore, we ask that you please give [place] 2 business days to thoroughly review all your results before contacting the office for clarification. Should we see a critical lab result, you will be contacted sooner.   If You Need Anything After Your Visit  If you have any questions or concerns for your doctor, please call our main line at [phone] If no one answers, please leave a voicemail as directed and we will return your call as soon as possible. Messages left after 4 pm will be answered the following business day.   You may also send [place] a message via MyChart. We typically respond to MyChart messages within 1-2 business days.  For prescription refills, please ask your pharmacy to contact our office. Our fax number is [phone].  If you have an urgent issue when the clinic is closed that cannot wait until the next business day, you can page your doctor at the number below.    Please note that while we do our best to be available for urgent issues outside of office hours, we are not available 24/7.   If you have an urgent issue and are unable to reach [place], you may choose to seek medical care at your doctor's office, retail clinic, urgent care center, or emergency room.  If you have a medical emergency, please immediately call 911 or go to the emergency department. In the event of inclement weather, please call our main line at [phone] for an update on the status of any delays or  closures.  Dermatology Medication Tips: Please keep the boxes that topical medications come in in order to help keep track of the instructions about where and how to use these. Pharmacies typically print the medication instructions only on the boxes and not directly on the medication tubes.   If your medication is too expensive, please contact our office at [phone] or send [place] a message through MyChart.   We are unable to tell what your co-pay for medications will be in advance as this is different depending on your insurance coverage. However, we may be able to find a substitute medication at lower cost or fill out paperwork to get insurance to cover a needed medication.   If a prior authorization is required to get your medication covered by your insurance company, please allow [place] 1-2 business days to complete this process.  Drug prices often vary depending on where the prescription is filled and some pharmacies may offer cheaper prices.  The website www.goodrx.com contains coupons for medications through different pharmacies. The prices here do not account for what the cost may be with help from insurance (it may be cheaper with your insurance), but the website can give you the price if you did not use any insurance.  - You can print the associated coupon and take it with your prescription to the pharmacy.  - You may also stop by our office during regular business hours and pick up a GoodRx coupon card.  - If  you need your prescription sent electronically to a different pharmacy, notify our office through [facility] or by phone at [phone]    Skin Education :   I counseled the patient regarding the following: Sun screen (SPF 30 or greater) should be applied during peak UV exposure (between 10am and 2pm) and reapplied after exercise or swimming.  The ABCDEs of melanoma were reviewed with the patient, and the importance of monthly self-examination of moles was emphasized.  Should any moles change in shape or color, or itch, bleed or burn, pt will contact our office for evaluation sooner then their interval appointment.  Plan: Sunscreen Recommendations I recommended a broad spectrum sunscreen with a SPF of 30 or higher. I explained that SPF 30 sunscreens block approximately 97 percent of the sun's harmful rays. Sunscreens should be applied at least 15 minutes prior to expected sun exposure and then every 2 hours after that as long as sun exposure continues. If swimming or exercising sunscreen should be reapplied every 45 minutes to an hour after getting wet or sweating. One ounce, or the equivalent of a shot glass full of sunscreen, is adequate to protect the skin not covered by a bathing suit. I also recommended a lip balm with a sunscreen as well. Sun protective clothing can be used in lieu of sunscreen but must be worn the entire time you are exposed to the sun's rays.  Patient Handout: Wound Care for Skin Biopsy Site  Taking Care of Your Skin Biopsy Site  Proper care of the biopsy site is essential for promoting healing and minimizing scarring. This handout provides instructions on how to care for your biopsy site to ensure optimal recovery.  1. Cleaning the Wound:  Clean the biopsy site daily with gentle soap and water. Gently pat the area dry with a clean, soft towel. Avoid harsh scrubbing or rubbing the area, as this can irritate the skin and delay healing.  2. Applying Aquaphor and Bandage:  After cleaning the wound, apply a thin layer of Aquaphor ointment to the biopsy site. Cover the area with a sterile bandage to protect it from dirt, bacteria, and friction. Change the bandage daily or as needed if it becomes soiled or wet.  3. Continued Care for One Week:  Repeat the cleaning, Aquaphor application, and bandaging process daily for one week following the biopsy procedure. Keeping the wound clean and moist during this initial healing period will help  prevent infection and promote optimal healing.  4. Massaging Aquaphor into the Area:  ---After one week, discontinue the use of bandages but continue to apply Aquaphor to the biopsy site. ----Gently massage the Aquaphor into the area using circular motions. ---Massaging the skin helps to promote circulation and prevent the formation of scar tissue.   Additional Tips:  Avoid exposing the biopsy site to direct sunlight during the healing process, as this can cause hyperpigmentation or worsen scarring. If you experience any signs of infection, such as increased redness, swelling, warmth, or drainage from the wound, contact your healthcare provider immediately. Follow any additional instructions provided by your healthcare provider for caring for the biopsy site and managing any discomfort. Conclusion:  Taking proper care of your skin biopsy site is crucial for ensuring optimal healing and minimizing scarring. By following these instructions for cleaning, applying Aquaphor, and massaging the area, you can promote a smooth and successful recovery. If you have any questions or concerns about caring for your biopsy site, don't hesitate to contact your healthcare  provider for guidance.  For areas treated with Liquid Nitrogen:  Keep clean with soap and water.  Apply Vaseline or Aquaphor twice daily.

## 2024-02-07 LAB — SURGICAL PATHOLOGY

## 2024-03-01 ENCOUNTER — Ambulatory Visit (INDEPENDENT_AMBULATORY_CARE_PROVIDER_SITE_OTHER): Admitting: Dermatology

## 2024-03-01 ENCOUNTER — Encounter: Payer: Self-pay | Admitting: Dermatology

## 2024-03-01 VITALS — BP 107/75 | HR 86

## 2024-03-01 DIAGNOSIS — B079 Viral wart, unspecified: Secondary | ICD-10-CM

## 2024-03-01 NOTE — Patient Instructions (Signed)

## 2024-03-01 NOTE — Progress Notes (Signed)
   Follow-Up Visit   Subjective  Denise Strong is a 48 y.o. female who presents for the following: warts Pt here to follow up on warts on right fingers which were frozen and she used 5FU and compound w at home. She said the warts are gone.  The following portions of the chart were reviewed this encounter and updated as appropriate: medications, allergies, medical history  Review of Systems:  No other skin or systemic complaints except as noted in HPI or Assessment and Plan.  Objective  Well appearing patient in no apparent distress; mood and affect are within normal limits.  A focused examination was performed of the following areas: Right fingers  Relevant exam findings are noted in the Assessment and Plan.    Assessment & Plan   WART on right fingers Exam: verrucous papule(s) at last visit--clear today  - Improved - Recommend watch for recurrence and OK to restart topical 5FU if lesions are there.  VIRAL WARTS, UNSPECIFIED TYPE   Related Medications fluorouracil  (EFUDEX ) 5 % cream Apply topically every night under duct tape  Return if symptoms worsen or fail to improve.  I, Wilson Hasten, CMA, am acting as scribe for [name], MD.   Documentation: I have reviewed the above documentation for accuracy and completeness, and I agree with the above.  [name], MD

## 2024-03-07 ENCOUNTER — Other Ambulatory Visit: Payer: Self-pay

## 2024-04-10 ENCOUNTER — Other Ambulatory Visit: Payer: Self-pay | Admitting: Family Medicine

## 2024-04-10 ENCOUNTER — Other Ambulatory Visit: Payer: Self-pay

## 2024-04-10 ENCOUNTER — Other Ambulatory Visit (HOSPITAL_COMMUNITY): Payer: Self-pay

## 2024-04-10 MED ORDER — PHENTERMINE HCL 15 MG PO CAPS
15.0000 mg | ORAL_CAPSULE | ORAL | 2 refills | Status: DC
  Filled 2024-04-10: qty 30, 30d supply, fill #0
  Filled 2024-05-10: qty 30, 30d supply, fill #1
  Filled 2024-06-22: qty 30, 30d supply, fill #2

## 2024-05-09 NOTE — Procedures (Signed)
Result scanned to media

## 2024-05-10 ENCOUNTER — Other Ambulatory Visit: Payer: Self-pay

## 2024-07-23 ENCOUNTER — Other Ambulatory Visit: Payer: Self-pay | Admitting: Family Medicine

## 2024-07-24 ENCOUNTER — Encounter (HOSPITAL_COMMUNITY): Payer: Self-pay

## 2024-07-24 ENCOUNTER — Other Ambulatory Visit: Payer: Self-pay

## 2024-07-24 ENCOUNTER — Other Ambulatory Visit (HOSPITAL_COMMUNITY): Payer: Self-pay

## 2024-10-10 ENCOUNTER — Other Ambulatory Visit: Payer: Self-pay | Admitting: Family Medicine

## 2024-10-10 ENCOUNTER — Encounter (HOSPITAL_COMMUNITY): Payer: Self-pay

## 2024-10-10 ENCOUNTER — Other Ambulatory Visit (HOSPITAL_COMMUNITY): Payer: Self-pay

## 2024-10-11 ENCOUNTER — Other Ambulatory Visit (HOSPITAL_COMMUNITY): Payer: Self-pay

## 2024-10-11 ENCOUNTER — Ambulatory Visit (INDEPENDENT_AMBULATORY_CARE_PROVIDER_SITE_OTHER): Admitting: Family Medicine

## 2024-10-11 ENCOUNTER — Encounter: Payer: Self-pay | Admitting: Family Medicine

## 2024-10-11 VITALS — BP 132/71 | HR 84 | Ht 65.0 in | Wt 176.0 lb

## 2024-10-11 DIAGNOSIS — Z6830 Body mass index (BMI) 30.0-30.9, adult: Secondary | ICD-10-CM | POA: Diagnosis not present

## 2024-10-11 DIAGNOSIS — Z0000 Encounter for general adult medical examination without abnormal findings: Secondary | ICD-10-CM

## 2024-10-11 DIAGNOSIS — E66811 Obesity, class 1: Secondary | ICD-10-CM

## 2024-10-11 DIAGNOSIS — E782 Mixed hyperlipidemia: Secondary | ICD-10-CM

## 2024-10-11 DIAGNOSIS — R7303 Prediabetes: Secondary | ICD-10-CM

## 2024-10-11 DIAGNOSIS — E559 Vitamin D deficiency, unspecified: Secondary | ICD-10-CM

## 2024-10-11 DIAGNOSIS — E663 Overweight: Secondary | ICD-10-CM | POA: Diagnosis not present

## 2024-10-11 MED ORDER — BUPROPION HCL ER (XL) 300 MG PO TB24
300.0000 mg | ORAL_TABLET | Freq: Every day | ORAL | 3 refills | Status: AC
  Filled 2024-10-11: qty 90, 90d supply, fill #0
  Filled 2024-11-02: qty 90, 90d supply, fill #1

## 2024-10-11 MED ORDER — PHENTERMINE HCL 30 MG PO CAPS
30.0000 mg | ORAL_CAPSULE | ORAL | 2 refills | Status: AC
  Filled 2024-10-11: qty 30, 30d supply, fill #0
  Filled 2024-11-02 – 2024-11-08 (×2): qty 30, 30d supply, fill #1

## 2024-10-11 NOTE — Patient Instructions (Signed)
" °  It was nice to see you today,  We addressed the following topics today: - I have sent in refills of your bupropion  - I have sent in an increased dose of the phentermine .   - I will let you know the results of your labs when I get them.    Have a great day,  Rolan Slain, MD   "

## 2024-10-11 NOTE — Progress Notes (Signed)
 "  Established Patient Office Visit  Subjective   Patient ID: Denise Strong, female    DOB: January 19, 1976  Age: 48 y.o. MRN: [id number]  Chief Complaint  Patient presents with   Annual Exam     History of Present Illness   A 48 year old female presents for an annual physical exam.   She has been taking Wellbutrin  300 mg once daily and phentermine  15 mg, which she started in early 2024. Has been out of the phentermine  for a few months. Did not see significant weight improvement with the phentermine  at 15mg  Her weight has remained stable, with a slight increase of about five pounds since January of last year.  She has a history of prediabetes and is currently on metformin . She inquires about the timing of her last A1c check, as she is aware she is in the prediabetic range and is on metformin . She is also on Crestor  and Tricor  for cholesterol management, and her triglycerides have been high in the past.  She mentions that she gets her mammograms and Pap smears done  at [facility] and has had a colonoscopy with a polyp found, requiring a follow-up in two to three years from now       The 10-year ASCVD risk score (Arnett DK, et al., 2019) is: 0.7%  Health Maintenance Due  Topic Date Due   Hepatitis B Vaccines 19-59 Average Risk (1 of 3 - 19+ 3-dose series) Never done   Mammogram  09/27/2021   COVID-19 Vaccine (4 - 2025-26 season) 06/12/2024      Objective:     BP 132/71   Pulse 84   Ht 5' 5 (1.651 m)   Wt 176 lb (79.8 kg)   LMP 10/06/2024   SpO2 97%   BMI 29.29 kg/m    Physical Exam     Gen: alert, oriented HEENT: perrla, eomi, mmm CV: rrr, no murmur Pulm: lctab. No wheeze or crackles.  GI: soft, nbs.  Nontender to palpation MSK: strength equal b/l. Normal gait Ext: no pedal edema Skin: warm and dry, no rashes Psych: pleasant affect.  Spontaneous speech        Assessment & Plan:   Physical exam, annual  Mixed hyperlipidemia Assessment &  Plan: Triglycerides have been high in the past. Current management includes Crestor  and Tricor .  - Ordered non-fasting lipid panel. - Continue Crestor  and Tricor .  Orders: -     Comprehensive metabolic panel with GFR -     Lipid panel  Prediabetes Assessment & Plan: Previously in the prediabetic range. Currently on metformin . A1c to be checked today as part of routine monitoring. - Ordered A1c test. - Continue metformin .  Orders: -     Hemoglobin A1c  Vitamin D  deficiency Assessment & Plan: Vitamin D  levels to be checked as part of routine blood work. - Ordered vitamin D  level.  Orders: -     VITAMIN D  25 Hydroxy (Vit-D Deficiency, Fractures)  Overweight -     Hemoglobin A1c -     Comprehensive metabolic panel with GFR -     Lipid panel -     VITAMIN D  25 Hydroxy (Vit-D Deficiency, Fractures)  Class 1 obesity with serious comorbidity and body mass index (BMI) of 30.0 to 30.9 in adult, unspecified obesity type Assessment & Plan: Weight increased by five pounds since January last year. Previously on phentermine  15 mg with some success. Discussed increasing phentermine  to 30 mg for better weight management. Discussed alternative weight management options including  GLP-1 agonists, but cost is a barrier. - Increased phentermine  to 30 mg daily. - Discussed GLP-1 agonists as an alternative, noting cost barriers.   Healthcare maintenance Assessment & Plan: Routine health maintenance discussed. Will obtain records from OB/GYN for mammogram and Pap smear. Colonoscopy performed with a polyp found, follow-up in 2-3 years. - Will obtain records from OB/GYN for mammogram and Pap smear. - Will schedule follow-up colonoscopy in 2-3 years.    Other orders -     buPROPion  HCl ER (XL); Take 1 tablet (300 mg total) by mouth daily.  Dispense: 90 tablet; Refill: 3 -     Phentermine  HCl; Take 1 capsule (30 mg total) by mouth every morning.  Dispense: 30 capsule; Refill: 2     Return in  about 2 months (around 12/09/2024) for weight, virtual visit.    Toribio MARLA Slain, MD  "

## 2024-10-12 LAB — COMPREHENSIVE METABOLIC PANEL WITH GFR
ALT: 14 IU/L (ref 0–32)
AST: 20 IU/L (ref 0–40)
Albumin: 4.3 g/dL (ref 3.9–4.9)
Alkaline Phosphatase: 43 IU/L (ref 41–116)
BUN/Creatinine Ratio: 19 (ref 9–23)
BUN: 13 mg/dL (ref 6–24)
Bilirubin Total: 0.2 mg/dL (ref 0.0–1.2)
CO2: 24 mmol/L (ref 20–29)
Calcium: 9.2 mg/dL (ref 8.7–10.2)
Chloride: 107 mmol/L — ABNORMAL HIGH (ref 96–106)
Creatinine, Ser: 0.7 mg/dL (ref 0.57–1.00)
Globulin, Total: 2.5 g/dL (ref 1.5–4.5)
Glucose: 87 mg/dL (ref 70–99)
Potassium: 4.2 mmol/L (ref 3.5–5.2)
Sodium: 142 mmol/L (ref 134–144)
Total Protein: 6.8 g/dL (ref 6.0–8.5)
eGFR: 107 mL/min/1.73

## 2024-10-12 LAB — HEMOGLOBIN A1C
Est. average glucose Bld gHb Est-mCnc: 117 mg/dL
Hgb A1c MFr Bld: 5.7 % — ABNORMAL HIGH (ref 4.8–5.6)

## 2024-10-12 LAB — LIPID PANEL
Chol/HDL Ratio: 3 ratio (ref 0.0–4.4)
Cholesterol, Total: 158 mg/dL (ref 100–199)
HDL: 53 mg/dL
LDL Chol Calc (NIH): 76 mg/dL (ref 0–99)
Triglycerides: 169 mg/dL — ABNORMAL HIGH (ref 0–149)
VLDL Cholesterol Cal: 29 mg/dL (ref 5–40)

## 2024-10-12 LAB — VITAMIN D 25 HYDROXY (VIT D DEFICIENCY, FRACTURES): Vit D, 25-Hydroxy: 26 ng/mL — ABNORMAL LOW (ref 30.0–100.0)

## 2024-10-13 ENCOUNTER — Ambulatory Visit: Payer: Self-pay | Admitting: Family Medicine

## 2024-10-14 DIAGNOSIS — Z0000 Encounter for general adult medical examination without abnormal findings: Secondary | ICD-10-CM | POA: Insufficient documentation

## 2024-10-14 NOTE — Assessment & Plan Note (Signed)
 Previously in the prediabetic range. Currently on metformin . A1c to be checked today as part of routine monitoring. - Ordered A1c test. - Continue metformin .

## 2024-10-14 NOTE — Assessment & Plan Note (Signed)
 Routine health maintenance discussed. Will obtain records from OB/GYN for mammogram and Pap smear. Colonoscopy performed with a polyp found, follow-up in 2-3 years. - Will obtain records from OB/GYN for mammogram and Pap smear. - Will schedule follow-up colonoscopy in 2-3 years.

## 2024-10-14 NOTE — Assessment & Plan Note (Signed)
 Triglycerides have been high in the past. Current management includes Crestor  and Tricor .  - Ordered non-fasting lipid panel. - Continue Crestor  and Tricor .

## 2024-10-14 NOTE — Assessment & Plan Note (Signed)
 Weight increased by five pounds since January last year. Previously on phentermine  15 mg with some success. Discussed increasing phentermine  to 30 mg for better weight management. Discussed alternative weight management options including GLP-1 agonists, but cost is a barrier. - Increased phentermine  to 30 mg daily. - Discussed GLP-1 agonists as an alternative, noting cost barriers.

## 2024-10-14 NOTE — Assessment & Plan Note (Signed)
 Vitamin D  levels to be checked as part of routine blood work. - Ordered vitamin D  level.

## 2024-11-02 ENCOUNTER — Other Ambulatory Visit: Payer: Self-pay

## 2024-11-02 ENCOUNTER — Other Ambulatory Visit (HOSPITAL_COMMUNITY): Payer: Self-pay

## 2024-11-06 ENCOUNTER — Other Ambulatory Visit: Payer: Self-pay

## 2024-11-08 ENCOUNTER — Other Ambulatory Visit: Payer: Self-pay

## 2024-11-10 ENCOUNTER — Other Ambulatory Visit (HOSPITAL_COMMUNITY): Payer: Self-pay

## 2024-11-11 ENCOUNTER — Other Ambulatory Visit (HOSPITAL_COMMUNITY): Payer: Self-pay

## 2024-12-11 ENCOUNTER — Ambulatory Visit: Admitting: Family Medicine

## 2025-03-01 ENCOUNTER — Ambulatory Visit: Admitting: Dermatology
